# Patient Record
Sex: Female | Born: 1960 | Race: White | Hispanic: No | Marital: Married | Smoking: Never smoker
Health system: Southern US, Community
[De-identification: ages and names within clinical notes are randomized; demographics above are authoritative.]

## PROBLEM LIST (undated history)

## (undated) DIAGNOSIS — G40909 Epilepsy, unspecified, not intractable, without status epilepticus: Secondary | ICD-10-CM

## (undated) DIAGNOSIS — E119 Type 2 diabetes mellitus without complications: Secondary | ICD-10-CM

## (undated) DIAGNOSIS — D649 Anemia, unspecified: Secondary | ICD-10-CM

## (undated) DIAGNOSIS — K259 Gastric ulcer, unspecified as acute or chronic, without hemorrhage or perforation: Secondary | ICD-10-CM

## (undated) DIAGNOSIS — I10 Essential (primary) hypertension: Secondary | ICD-10-CM

## (undated) DIAGNOSIS — J45909 Unspecified asthma, uncomplicated: Secondary | ICD-10-CM

## (undated) DIAGNOSIS — E7800 Pure hypercholesterolemia, unspecified: Secondary | ICD-10-CM

## (undated) DIAGNOSIS — M1990 Unspecified osteoarthritis, unspecified site: Secondary | ICD-10-CM

## (undated) HISTORY — DX: Type 2 diabetes mellitus without complications: E11.9

## (undated) HISTORY — DX: Epilepsy, unspecified, not intractable, without status epilepticus: G40.909

## (undated) HISTORY — DX: Unspecified osteoarthritis, unspecified site: M19.90

## (undated) HISTORY — PX: ABDOMINAL HYSTERECTOMY: SHX81

## (undated) HISTORY — DX: Anemia, unspecified: D64.9

## (undated) HISTORY — DX: Essential (primary) hypertension: I10

## (undated) HISTORY — DX: Pure hypercholesterolemia, unspecified: E78.00

## (undated) HISTORY — PX: REPLACEMENT TOTAL KNEE: SUR1224

---

## 2004-12-15 ENCOUNTER — Ambulatory Visit: Payer: Self-pay | Admitting: Unknown Physician Specialty

## 2006-01-05 ENCOUNTER — Ambulatory Visit: Payer: Self-pay | Admitting: Internal Medicine

## 2007-02-02 HISTORY — PX: BREAST SURGERY: SHX581

## 2007-07-26 ENCOUNTER — Ambulatory Visit: Payer: Self-pay | Admitting: Internal Medicine

## 2007-08-07 ENCOUNTER — Ambulatory Visit: Payer: Self-pay | Admitting: Internal Medicine

## 2007-08-29 ENCOUNTER — Ambulatory Visit: Payer: Self-pay | Admitting: General Surgery

## 2008-03-21 ENCOUNTER — Ambulatory Visit: Payer: Self-pay | Admitting: Internal Medicine

## 2009-03-24 ENCOUNTER — Ambulatory Visit: Payer: Self-pay | Admitting: Internal Medicine

## 2010-03-31 ENCOUNTER — Ambulatory Visit: Payer: Self-pay | Admitting: Internal Medicine

## 2011-04-20 ENCOUNTER — Ambulatory Visit: Payer: Self-pay | Admitting: Internal Medicine

## 2011-08-09 ENCOUNTER — Ambulatory Visit: Payer: Self-pay | Admitting: Specialist

## 2011-08-09 LAB — URINALYSIS, COMPLETE
Bilirubin,UR: NEGATIVE
Blood: NEGATIVE
Hyaline Cast: 1
Ph: 7 (ref 4.5–8.0)
RBC,UR: 1 /HPF (ref 0–5)
Specific Gravity: 1.008 (ref 1.003–1.030)
Squamous Epithelial: 1
Transitional Epi: <1

## 2011-08-09 LAB — CBC
MCH: 28.6 pg (ref 26.0–34.0)
MCV: 87 fL (ref 80–100)
Platelet: 214 10*3/uL (ref 150–440)
RDW: 15.2 % — ABNORMAL HIGH (ref 11.5–14.5)
WBC: 7.9 10*3/uL (ref 3.6–11.0)

## 2011-08-09 LAB — BASIC METABOLIC PANEL
Anion Gap: 8 (ref 7–16)
Co2: 31 mmol/L (ref 21–32)
Creatinine: 0.75 mg/dL (ref 0.60–1.30)
EGFR (African American): >60
Osmolality: 282 (ref 275–301)

## 2011-08-09 LAB — MRSA PCR SCREENING

## 2011-08-09 LAB — PROTIME-INR
INR: 0.9
Prothrombin Time: 12.7 secs (ref 11.5–14.7)

## 2011-08-17 ENCOUNTER — Inpatient Hospital Stay: Payer: Self-pay | Admitting: Specialist

## 2011-08-18 LAB — CBC WITH DIFFERENTIAL/PLATELET
Basophil #: 0 10*3/uL (ref 0.0–0.1)
Eosinophil #: 0 10*3/uL (ref 0.0–0.7)
Eosinophil %: 0 %
Lymphocyte #: 1.2 10*3/uL (ref 1.0–3.6)
MCH: 29 pg (ref 26.0–34.0)
MCHC: 33.3 g/dL (ref 32.0–36.0)
MCV: 87 fL (ref 80–100)
Neutrophil #: 9 10*3/uL — ABNORMAL HIGH (ref 1.4–6.5)
Neutrophil %: 80.3 %
RBC: 4.16 10*6/uL (ref 3.80–5.20)
RDW: 15.6 % — ABNORMAL HIGH (ref 11.5–14.5)
WBC: 11.2 10*3/uL — ABNORMAL HIGH (ref 3.6–11.0)

## 2011-08-18 LAB — BASIC METABOLIC PANEL
Anion Gap: 13 (ref 7–16)
BUN: 13 mg/dL (ref 7–18)
Calcium, Total: 8.2 mg/dL — ABNORMAL LOW (ref 8.5–10.1)
Chloride: 97 mmol/L — ABNORMAL LOW (ref 98–107)
Co2: 27 mmol/L (ref 21–32)
EGFR (African American): >60
Osmolality: 275 (ref 275–301)
Potassium: 4.2 mmol/L (ref 3.5–5.1)

## 2011-08-19 LAB — HEMOGLOBIN: HGB: 10.1 g/dL — ABNORMAL LOW

## 2012-04-20 ENCOUNTER — Ambulatory Visit: Payer: Self-pay | Admitting: Internal Medicine

## 2012-04-24 ENCOUNTER — Ambulatory Visit: Payer: Self-pay | Admitting: Internal Medicine

## 2012-05-03 ENCOUNTER — Ambulatory Visit (INDEPENDENT_AMBULATORY_CARE_PROVIDER_SITE_OTHER): Payer: BC Managed Care – PPO | Admitting: General Surgery

## 2012-05-03 ENCOUNTER — Encounter: Payer: Self-pay | Admitting: General Surgery

## 2012-05-03 VITALS — BP 130/72 | HR 78 | Resp 16 | Ht 62.0 in | Wt 244.0 lb

## 2012-05-03 DIAGNOSIS — G40909 Epilepsy, unspecified, not intractable, without status epilepticus: Secondary | ICD-10-CM | POA: Insufficient documentation

## 2012-05-03 DIAGNOSIS — R920 Mammographic microcalcification found on diagnostic imaging of breast: Secondary | ICD-10-CM

## 2012-05-03 DIAGNOSIS — I10 Essential (primary) hypertension: Secondary | ICD-10-CM | POA: Insufficient documentation

## 2012-05-03 NOTE — Patient Instructions (Addendum)
The stereotactic procedure was reviewed with the patient. The potential for bleeding, infection and pain was reviewed. At this time, the benefits outweigh the risk, and the patient is amenable to proceed.  Patient has been scheduled for a right breast stereotactic biopsy at [facility] for 05-15-12 at 4 pm (arrive 3:30 pm). She is aware of all instructions.

## 2012-05-03 NOTE — Progress Notes (Signed)
Patient ID: Dana Murillo, female   DOB: [birth year], 52 y.o.   MRN: [id number]  Chief Complaint  Patient presents with  . Breast Problem    HPI Dana Murillo is a 52 y.o. female.  Patient here today for right breast evaluation referred by Dr Arlana Pouch.  States that "I can't feel anything".  No breast issues.  Patient with known history of right breast calcifications July 2009. No family history of breast cancer. Denies breast trauma. HPI  Past Medical History  Diagnosis Date  . Hypertension   . High cholesterol   . Epilepsia   . Arthritis     Past Surgical History  Procedure Laterality Date  . Abdominal hysterectomy    . Replacement total knee    . Breast surgery Right 2009    No family history on file.  Social History History  Substance Use Topics  . Smoking status: Never Smoker   . Smokeless tobacco: Never Used  . Alcohol Use: No    No Known Allergies  Current Outpatient Prescriptions  Medication Sig Dispense Refill  . Calcium Carbonate-Vit D-Min (CALCIUM 1200 PO) Take by mouth.      . carvedilol (COREG) 12.5 MG tablet Take 12.5 mg by mouth 2 (two) times daily with a meal.      . diclofenac (VOLTAREN) 75 MG EC tablet Take 75 mg by mouth 2 (two) times daily.      . divalproex (DEPAKOTE) 250 MG DR tablet Take 250 mg by mouth 2 (two) times daily.      . divalproex (DEPAKOTE) 500 MG DR tablet Take 500 mg by mouth 1 day or 1 dose.      . fish oil-omega-3 fatty acids 1000 MG capsule Take 2 g by mouth daily.      . Multiple Vitamins-Minerals (MULTIVITAMIN WITH MINERALS) tablet Take 1 tablet by mouth daily.      . simvastatin (ZOCOR) 40 MG tablet Take 40 mg by mouth every evening.       No current facility-administered medications for this visit.    Review of Systems Review of Systems  Constitutional: Negative.   Respiratory: Negative.   Cardiovascular: Negative.     Blood pressure 130/72, pulse 78, resp. rate 16, height 5\' 2"  (1.575 m), weight 244 lb (110.678  kg).  Physical Exam Physical Exam  Constitutional: She is oriented to person, place, and time. She appears well-developed and well-nourished.  Cardiovascular: Normal rate and regular rhythm.   Pulmonary/Chest: Effort normal and breath sounds normal. Right breast exhibits no inverted nipple, no mass, no nipple discharge, no skin change and no tenderness. Left breast exhibits no inverted nipple, no mass, no nipple discharge, no skin change and no tenderness.  Lymphadenopathy:    She has no cervical adenopathy.    She has no axillary adenopathy.  Neurological: She is alert and oriented to person, place, and time.  Skin: Skin is warm and dry.    Data Reviewed Screening mammograms dated 04/20/2012 identified microcalcifications in the upper-outer area of the right breast. Slightly more prominent than past exams. Able calcifications throughout the remainder of both breasts. BI-RAD-0.  Focal spot compression views did 04/24/2012 showed multiple groupings of calcification more prominent than past studies. No layering to suggest milk of calcium. BI-RAD-4. Right breast stereotactic core biopsy pathology report dated 08/29/2007 showed benign fibrous breast tissue with nonproliferative fibrocystic changes, no evidence of malignancy. Vicryl calcifications identified within areas of apical change as well as uninvolved benign breast tissue.  Assessment  Recurrent microcalcifications of the right breast.    Plan    Considering the increased density of microcalcifications in the upper-outer quadrant of the breast since 2009 biopsy repeat biopsy has been recommended.       Earline Mayotte 05/03/2012, 4:48 PM

## 2012-05-10 ENCOUNTER — Ambulatory Visit: Payer: Self-pay | Admitting: General Surgery

## 2012-05-15 ENCOUNTER — Ambulatory Visit: Payer: Self-pay | Admitting: General Surgery

## 2012-05-15 DIAGNOSIS — R920 Mammographic microcalcification found on diagnostic imaging of breast: Secondary | ICD-10-CM

## 2012-05-16 ENCOUNTER — Telehealth: Payer: Self-pay | Admitting: *Deleted

## 2012-05-16 LAB — PATHOLOGY REPORT

## 2012-05-16 NOTE — Telephone Encounter (Signed)
Notified patient that the most recent right breast biopsy results were benign per Dr. Lemar Livings, patient pleased.  Reminded of nurse appointment.

## 2012-05-17 ENCOUNTER — Encounter: Payer: Self-pay | Admitting: General Surgery

## 2012-05-23 ENCOUNTER — Ambulatory Visit (INDEPENDENT_AMBULATORY_CARE_PROVIDER_SITE_OTHER): Payer: BC Managed Care – PPO | Admitting: *Deleted

## 2012-05-23 DIAGNOSIS — Z87898 Personal history of other specified conditions: Secondary | ICD-10-CM

## 2012-05-23 DIAGNOSIS — Z789 Other specified health status: Secondary | ICD-10-CM

## 2012-05-23 NOTE — Progress Notes (Signed)
Patient here today for follow up post right breast biopsy.  Half of her breast is bruised, states "I bruise easy". No signs of infection.  The patient is aware that a heating pad may be used for comfort as needed.  Aware of pathology.  When will she Follow up?

## 2012-05-23 NOTE — Patient Instructions (Addendum)
Follow-up 6 months

## 2012-05-24 NOTE — Progress Notes (Signed)
Quick Note:  The right breast biopsy dated 05/15/2012 showed benign breast tissue with apical microsystem focal complex gross lesions with associated microcalcifications. Sclerosing adenosis in usual ductal hyperplasia. No atypia or malignancy.    The patient has been notified of the results. Arrangements were made for followup right breast mammogram and office visit in 6 months. ______

## 2012-06-13 ENCOUNTER — Encounter: Payer: Self-pay | Admitting: General Surgery

## 2012-07-11 ENCOUNTER — Ambulatory Visit: Payer: Self-pay | Admitting: Specialist

## 2012-07-11 LAB — APTT: Activated PTT: 37.9 secs — ABNORMAL HIGH (ref 23.6–35.9)

## 2012-07-11 LAB — URINALYSIS, COMPLETE
Bacteria: NONE SEEN
Bilirubin,UR: NEGATIVE
Blood: NEGATIVE
Glucose,UR: NEGATIVE mg/dL (ref 0–75)
Ketone: NEGATIVE
Leukocyte Esterase: NEGATIVE
Nitrite: NEGATIVE
Ph: 7 (ref 4.5–8.0)
RBC,UR: <1 /HPF (ref 0–5)
WBC UR: NONE SEEN /HPF (ref 0–5)

## 2012-07-11 LAB — CBC
HGB: 13.8 g/dL (ref 12.0–16.0)
MCH: 28.4 pg (ref 26.0–34.0)
MCHC: 33.5 g/dL (ref 32.0–36.0)
MCV: 85 fL (ref 80–100)
Platelet: 247 10*3/uL (ref 150–440)
RBC: 4.87 10*6/uL (ref 3.80–5.20)

## 2012-07-11 LAB — BASIC METABOLIC PANEL
BUN: 15 mg/dL (ref 7–18)
Chloride: 102 mmol/L (ref 98–107)
Creatinine: 0.56 mg/dL — ABNORMAL LOW (ref 0.60–1.30)
EGFR (Non-African Amer.): >60
Osmolality: 279 (ref 275–301)
Potassium: 3.8 mmol/L (ref 3.5–5.1)
Sodium: 139 mmol/L (ref 136–145)

## 2012-07-11 LAB — PROTIME-INR
INR: 1
Prothrombin Time: 13.6 secs (ref 11.5–14.7)

## 2012-07-11 LAB — MRSA PCR SCREENING

## 2012-07-24 ENCOUNTER — Inpatient Hospital Stay: Payer: Self-pay | Admitting: Specialist

## 2012-07-25 LAB — CBC WITH DIFFERENTIAL/PLATELET
Basophil %: 0.3 %
Eosinophil %: 0.2 %
HCT: 37.4 % (ref 35.0–47.0)
Lymphocyte #: 1.3 10*3/uL (ref 1.0–3.6)
Lymphocyte %: 12.7 %
MCHC: 33.9 g/dL (ref 32.0–36.0)
MCV: 85 fL (ref 80–100)
Monocyte %: 8.8 %
Platelet: 217 10*3/uL (ref 150–440)
RBC: 4.41 10*6/uL (ref 3.80–5.20)
WBC: 10.3 10*3/uL (ref 3.6–11.0)

## 2012-07-25 LAB — BASIC METABOLIC PANEL
BUN: 11 mg/dL (ref 7–18)
Calcium, Total: 8.3 mg/dL — ABNORMAL LOW (ref 8.5–10.1)
Creatinine: 0.69 mg/dL (ref 0.60–1.30)
EGFR (Non-African Amer.): >60
Glucose: 107 mg/dL — ABNORMAL HIGH (ref 65–99)
Osmolality: 276 (ref 275–301)
Potassium: 4.1 mmol/L (ref 3.5–5.1)
Sodium: 138 mmol/L (ref 136–145)

## 2012-07-26 LAB — HEMOGLOBIN: HGB: 11.7 g/dL — ABNORMAL LOW (ref 12.0–16.0)

## 2012-10-02 HISTORY — PX: BREAST BIOPSY: SHX20

## 2012-11-22 ENCOUNTER — Ambulatory Visit: Payer: BC Managed Care – PPO | Admitting: General Surgery

## 2013-02-13 ENCOUNTER — Ambulatory Visit: Payer: Self-pay | Admitting: General Surgery

## 2013-02-14 ENCOUNTER — Telehealth: Payer: Self-pay | Admitting: General Surgery

## 2013-02-14 ENCOUNTER — Encounter: Payer: Self-pay | Admitting: General Surgery

## 2013-02-14 NOTE — Telephone Encounter (Signed)
Notified patient path and mammograms reviewed. I don't think she requires open biopsy. Will discuss in detail at follow up next week.

## 2013-02-21 ENCOUNTER — Encounter: Payer: Self-pay | Admitting: General Surgery

## 2013-02-21 ENCOUNTER — Ambulatory Visit (INDEPENDENT_AMBULATORY_CARE_PROVIDER_SITE_OTHER): Payer: BC Managed Care – PPO | Admitting: General Surgery

## 2013-02-21 VITALS — BP 140/72 | HR 76 | Resp 14 | Ht 62.0 in | Wt 249.0 lb

## 2013-02-21 DIAGNOSIS — R920 Mammographic microcalcification found on diagnostic imaging of breast: Secondary | ICD-10-CM

## 2013-02-21 NOTE — Patient Instructions (Addendum)
Patient to return in 6 months bilateral diagnotic mammogram 

## 2013-02-21 NOTE — Progress Notes (Signed)
Patient ID: Dana Murillo, female   DOB: [birth year], 53 y.o.   MRN: [id number]  Chief Complaint  Patient presents with  . Follow-up    mammogram    HPI Dana Murillo is a 53 y.o. female who presents for a breast evaluation. The most recent right breast mammogram was done on 02/13/13.Patient does perform regular self breast checks and gets regular mammograms done.    HPI  Past Medical History  Diagnosis Date  . Hypertension   . High cholesterol   . Epilepsia   . Arthritis     Past Surgical History  Procedure Laterality Date  . Abdominal hysterectomy    . Replacement total knee Bilateral [id number]  . Breast surgery Right 2009    No family history on file.  Social History History  Substance Use Topics  . Smoking status: Never Smoker   . Smokeless tobacco: Never Used  . Alcohol Use: No    No Known Allergies  Current Outpatient Prescriptions  Medication Sig Dispense Refill  . Calcium Carbonate-Vit D-Min (CALCIUM 1200 PO) Take by mouth.      . carvedilol (COREG) 12.5 MG tablet Take 12.5 mg by mouth 2 (two) times daily with a meal.      . diclofenac (VOLTAREN) 75 MG EC tablet Take 75 mg by mouth 2 (two) times daily.      . divalproex (DEPAKOTE) 250 MG DR tablet Take 250 mg by mouth 2 (two) times daily.      . divalproex (DEPAKOTE) 500 MG DR tablet Take 500 mg by mouth 1 day or 1 dose.      . fish oil-omega-3 fatty acids 1000 MG capsule Take 2 g by mouth daily.      . Multiple Vitamins-Minerals (MULTIVITAMIN WITH MINERALS) tablet Take 1 tablet by mouth daily.      . simvastatin (ZOCOR) 40 MG tablet Take 40 mg by mouth every evening.       No current facility-administered medications for this visit.    Review of Systems Review of Systems  Constitutional: Negative.   Respiratory: Negative.   Cardiovascular: Negative.     Blood pressure 140/72, pulse 76, resp. rate 14, height 5\' 2"  (1.575 m), weight 249 lb (112.946 kg).  Physical Exam Physical Exam  Constitutional:  She is oriented to person, place, and time. She appears well-developed and well-nourished.  Eyes: Conjunctivae are normal.  Neck: Neck supple.  Cardiovascular: Normal rate, regular rhythm and normal heart sounds.   Pulmonary/Chest: Effort normal and breath sounds normal. Right breast exhibits no inverted nipple, no mass, no nipple discharge, no skin change and no tenderness. Left breast exhibits no inverted nipple, no mass, no nipple discharge, no skin change and no tenderness.  Lymphadenopathy:    She has no cervical adenopathy.    She has no axillary adenopathy.  Neurological: She is alert and oriented to person, place, and time.  Skin: Skin is warm and dry.    Data Reviewed Right breast biopsy dated 05/15/2012 showed benign breast tissue with a prepping microcyst and focal complex sclerosing lesion with associated microcalcification. Sclerosing adenosis and usual ductal hyperplasia. No atypia or malignancy.  The pathologist was asked to review the slides and reports that the area of complex sclerosing lesion measured 2 mm in diameter.  Post biopsy mammogram dated 02/13/2013 identified a TOPHAT marker in the lateral portion of the right breast. A second posterior clip is noted. The anterior clip (TOPHAT) associated with architectural distortion likely secondary to the previous hematoma that developed  after the biopsy.  Ultrasound examination reported visualization of a hypoechoic biopsy track in the 10:00 position with associated hyperechoic breast tissue thought to represent fat necrosis. Impression was mammographic distortion in sonographic hypoechoic tissue in the area of previous biopsy consistent with fat necrosis. Not possible to completely exclude malignancy. Radiology recommendation for formal excision. Assessment    The patient had a 2 mm area of complex sclerosing lesion in a much larger volume of normal tissue. Considering a 9-gauge biopsy was completed the likelihood of additional  tissue a small. This issue has been reviewed with the patient in the past and a plan for continued observation acceptable to the patient and myself.    Plan    Options for repeat excision were reviewed with the possible risks associated with repeat current hematoma formation and pain as well as ongoing distortion of the remaining breast parenchyma. The possibility of the coldness malignancy was reviewed. At this time the patient desires and I agree with planned observation.       Robert Bellow 02/22/2013, 5:17 PM

## 2013-08-22 ENCOUNTER — Encounter: Payer: Self-pay | Admitting: General Surgery

## 2013-08-28 ENCOUNTER — Ambulatory Visit: Payer: BC Managed Care – PPO | Admitting: General Surgery

## 2013-09-05 ENCOUNTER — Ambulatory Visit (INDEPENDENT_AMBULATORY_CARE_PROVIDER_SITE_OTHER): Payer: BC Managed Care – PPO | Admitting: General Surgery

## 2013-09-05 ENCOUNTER — Encounter: Payer: Self-pay | Admitting: General Surgery

## 2013-09-05 VITALS — BP 124/72 | HR 74 | Resp 14 | Ht 62.0 in | Wt 257.0 lb

## 2013-09-05 DIAGNOSIS — R920 Mammographic microcalcification found on diagnostic imaging of breast: Secondary | ICD-10-CM

## 2013-09-05 NOTE — Progress Notes (Signed)
Patient ID: Dana Murillo, female   DOB: August 08, 1960, 53 y.o.   MRN: [id number]  Chief Complaint  Patient presents with  . Follow-up    mammogram    HPI Dana Murillo is a 53 y.o. female who presents for a breast evaluation. The most recent mammogram was done on 08/21/13 at [facility]. Patient does perform regular self breast checks and gets regular mammograms done.    HPI  Past Medical History  Diagnosis Date  . Hypertension   . High cholesterol   . Epilepsia   . Arthritis     Past Surgical History  Procedure Laterality Date  . Abdominal hysterectomy    . Replacement total knee Bilateral [id number]  . Breast surgery Right 2009    No family history on file.  Social History History  Substance Use Topics  . Smoking status: Never Smoker   . Smokeless tobacco: Never Used  . Alcohol Use: No    No Known Allergies  Current Outpatient Prescriptions  Medication Sig Dispense Refill  . Calcium Carbonate-Vit D-Min (CALCIUM 1200 PO) Take by mouth.      . carvedilol (COREG) 12.5 MG tablet Take 12.5 mg by mouth 2 (two) times daily with a meal.      . diclofenac (VOLTAREN) 75 MG EC tablet Take 75 mg by mouth 2 (two) times daily.      . divalproex (DEPAKOTE) 250 MG DR tablet Take 250 mg by mouth 2 (two) times daily.      . fish oil-omega-3 fatty acids 1000 MG capsule Take 2 g by mouth daily.      . fluticasone-salmeterol (ADVAIR HFA) 45-21 MCG/ACT inhaler Inhale 2 puffs into the lungs as needed.      Marland Kitchen glucosamine-chondroitin 500-400 MG tablet Take 1 tablet by mouth 3 (three) times daily.      . Multiple Vitamins-Minerals (MULTIVITAMIN WITH MINERALS) tablet Take 1 tablet by mouth daily.      . simvastatin (ZOCOR) 40 MG tablet Take 40 mg by mouth every evening.       No current facility-administered medications for this visit.    Review of Systems Review of Systems  Constitutional: Negative.   Respiratory: Negative.   Cardiovascular: Negative.     Blood pressure 124/72, pulse 74,  resp. rate 14, height 5\' 2"  (1.575 m), weight 257 lb (116.574 kg).  Physical Exam Physical Exam  Constitutional: She is oriented to person, place, and time. She appears well-developed and well-nourished.  Eyes: Conjunctivae are normal. No scleral icterus.  Neck: Neck supple.  Cardiovascular: Normal rate, regular rhythm and normal heart sounds.   Pulmonary/Chest: Effort normal and breath sounds normal. Right breast exhibits no inverted nipple, no mass, no nipple discharge, no skin change and no tenderness. Left breast exhibits no inverted nipple, no mass, no nipple discharge, no skin change and no tenderness.  Abdominal: Soft. Bowel sounds are normal. There is no tenderness.  Lymphadenopathy:    She has no cervical adenopathy.    She has no axillary adenopathy.  Neurological: She is alert and oriented to person, place, and time.  Skin: Skin is warm and dry.    Data Reviewed Right breast biopsy dated 05/15/2012 showed benign breast tissue with microcysts and a focal complex sclerosing lesion measuring 2 mm in diameter with associated microcalcification. Sclerosing adenosis and usual ductal hyperplasia. No atypia or malignancy.    Bilateral mammograms dated 08/21/2013 completed at [facility] were reviewed independently. No interval change. BI-RAD-4 based on previous report of a complex sclerosing  lesion. Assessment    Benign breast exam and mammogram.    Plan    The identification of a 2 mm sclerosing lesion in a large-volume of adjacent tissue is associated with a less than 1% risk of upstaging to any other pathologic process. No interval change on her mammogram the patient and I are both comfortable and continued observation. We'll arrange for bilateral diagnostic mammograms in one year.      PCP: Irven Easterly 09/07/2013, 10:12 AM

## 2013-09-05 NOTE — Patient Instructions (Addendum)
Patient to return to return in one year bilateral diagnotic mammogram.

## 2013-12-03 ENCOUNTER — Encounter: Payer: Self-pay | Admitting: General Surgery

## 2014-05-21 NOTE — H&P (Signed)
    Subjective/Chief Complaint Knee pain    History of Present Illness 54 year old female has advancd bilateral knee arthritis which has become incompatible with daily activities. She has had NSAIDs, injections, bracing and activity modification since 2008 which no longer give her relief.  X-rays show bone on bone arthritits medially with patellar spurring as well with some cyst formation.  She wishes to proceed with right total knee replacement notw.  Risks and benefits of surgery were discussed at length including but not limited to infection, non union, nerve or blood vessed damage, non union, need for repeat surgery, blood clots and lung emboli, and death. She has been approved for surgery by her medical doctors.   Past Med/Surgical Hx:  Recent hayfever: Had to use inhaler  Arthritis:   Obesity:   Hypertension:   Seizure disorder: Last 17 years ago  Right shoulder surgery:   D&C:   Breast Biopsy:   Hysterectomy, BSO:   ALLERGIES:  No Known Allergies:   HOME MEDICATIONS: Medication Instructions Status  Oyster Shell Calcium with Vitamin D 2 tab(s) orally once a day (in the morning) Active  multivitamin 1 tab(s) orally once a day (in the morning) Stop on 08/09/11 Active  Fish Oil 1000 mg oral capsule 4 cap(s) orally once a day (in the morning) Stop 08/09/11 Active  chondroitin-glucosamine 2 tab(s) orally once a day (in the morning) Stop 08/09/11 Active  fluticasone 50 mcg/inh nasal spray 2 spray(s) each nostril once a day (in the morning) Active  diclofenac potassium 50 mg oral tablet 1 tab(s) orally 2 times a day. Stop 08/09/11. Active  Depakote 500 mg oral delayed release tablet 1 tab(s) orally once a day at lunch Active  simvastatin 40 mg oral tablet 1 tab(s) orally once a day (at bedtime) Active  carvedilol 12.5 mg oral tablet 1 tab(s) orally 2 times a day. Take day of surgery before coming Active  Depakote 250 mg oral delayed release tablet 1 tab(s) orally 2 times a day morning and night.  Active  Advair Diskus 500 mcg-50 mcg inhalation powder 1 puff(s) inhaled 2 times a day. Use AM of surgery and bring with you. Active   Family and Social History:   Family History Non-Contributory    Social History negative tobacco, negative ETOH    Place of Living Home   Review of Systems:   Fever/Chills No    Cough No    Sputum No    Abdominal Pain No   Physical Exam:   GEN well developed, well nourished, obese    HEENT pink conjunctivae    NECK supple    RESP normal resp effort    CARD regular rate    ABD denies tenderness    LYMPH negative neck    EXTR negative edema, varus both knees with medial joint line pain and tenderness.  crepitus with range of motion from 5-90*.  circulation/sensation/motor function good distally.  stability good.    SKIN normal to palpation    NEURO motor/sensory function intact    PSYCH alert, A+O to time, place, person     Assessment/Admission Diagnosis Bilateral knee arthritis--advanced    Plan Right total knee replacement   Electronic Signatures: Park Breed (MD)  (Signed 15-Jul-13 15:29)  Authored: CHIEF COMPLAINT and HISTORY, PAST MEDICAL/SURGIAL HISTORY, ALLERGIES, HOME MEDICATIONS, FAMILY AND SOCIAL HISTORY, REVIEW OF SYSTEMS, PHYSICAL EXAM, ASSESSMENT AND PLAN   Last Updated: 15-Jul-13 15:29 by Park Breed (MD)

## 2014-05-21 NOTE — Discharge Summary (Signed)
PATIENT NAME:  Dana Murillo, Dana Murillo MR#:  [id number] DATE OF BIRTH:  [birth year]  DATE OF ADMISSION:  08/17/2011 DATE OF DISCHARGE:  08/20/2011  FINAL DIAGNOSES:  1. Advanced bilateral knee arthritis. 2. Obesity.  3. Hypertension.  4. Hay fever.  5. Seizure disorder, remote.   PROCEDURE: 08/17/2011 cemented right DePuy LCS rotating platform total knee replacement.   COMPLICATIONS: None.   CONSULTATIONS: None.   DISCHARGE MEDICATIONS:  1. Oyster shell calcium.  2. Multivitamins. 3. Fish oil. 4. Chondroitin/glucosamine. 5. Fluticasone 50-mcg inhaler, two sprays each nostril q. a.m.  6. Depakote 500 mg daily.  7. Simvastatin 40 mg daily.  8. Carvedilol 12.5 mg b.i.d.  9. Depakote 250 mg b.i.d.  10. Advair Diskus 500 mcg 1 puff b.i.d.  11. Norco 5/325 p.r.n. pain.  12. Enteric-coated aspirin one p.o. b.i.d.  13. Iron 1 tablet daily.   HISTORY OF PRESENT ILLNESS: The patient is a 54 year old female with advanced bilateral knee arthritis. It has been incompatible with daily activities. She could not do stairs or bend well. She has had NSAID treatment, injections, braces, and activity modifications since 2008, which no longer give her relief. X-rays show bone-on-bone arthritis medially with patellar spurring as well as some cyst formation. Risks and benefits of surgery were discussed with her preoperatively and she wished to proceed with a right total knee replacement.   PAST MEDICAL HISTORY:  ILLNESSES: As above.   ALLERGIES:  None.   MEDICATIONS: As above.  REVIEW OF SYSTEMS: Unremarkable.   FAMILY HISTORY: Unremarkable.   SOCIAL HISTORY: The patient does not smoke or drink and lives at home with her husband.   PHYSICAL EXAMINATION: Vital signs were normal. The right knee showed motion from 5 to 90 degrees. Circulation, sensation, and motor function were good distally. Stability was good. There was pain along the medial joint line. X-rays of the right knee showed advanced  osteoarthritis with spurs and cyst formation.   HOSPITAL COURSE: The patient underwent a right total knee replacement on 08/17/2011. Postoperatively she did well. Her hemoglobin was 10.1 on the first postoperative day. She did well with physical therapy. Her wound remained clean and dry. She was discharged on 08/20/2011. She will be seen in my office in two weeks. She is partial weight-bearing with a walker. She will get home physical therapy.   ____________________________ Park Breed, MD hem:bjt D: 08/30/2011 15:39:03 ET T: 08/31/2011 13:54:29 ET JOB#: [id number]  cc: Park Breed, MD, <Dictator> Leona Carry. Hall Busing, MD Park Breed MD ELECTRONICALLY SIGNED 09/01/2011 8:20

## 2014-05-21 NOTE — Op Note (Signed)
PATIENT NAME:  Dana Murillo, RAPE MR#:  [id number] DATE OF BIRTH:  1960-11-04  DATE OF PROCEDURE:  08/17/2011  PREOPERATIVE DIAGNOSIS: Advanced osteoarthritis, right knee.   POSTOPERATIVE DIAGNOSIS: Advanced osteoarthritis, right knee.   PROCEDURE PERFORMED: DePuy cemented LCS rotating platform total knee replacement (standard femur/patella, 12.5 mm polyethylene bearing, #2.5 keeled tibia).   SURGEON: Park Breed, MD  ANESTHESIA: Spinal.   COMPLICATIONS: None.   DRAINS: Two Autovac.   ESTIMATED BLOOD LOSS: Minimal.   REPLACEMENTS: None.   DESCRIPTION OF PROCEDURE: Patient was brought to the Operating Room where she underwent satisfactory spinal anesthesia and was placed in supine position on the Operating Room table and padded appropriately. The right leg was prepped and draped in sterile fashion. Esmarch was applied and tourniquet inflated to 350 mmHg. Tourniquet time was 117 minutes. A longitudinal midline anterior incision was made and dissection carried out sharply subcutaneous tissue. Medial arthrotomy was carried out and soft tissue debridement carried out. The tibial alignment jig was put in place and anterior cutting block pinned. The tibial cut was made. The ligaments were then balanced releasing some medially. The femur was sized as a standard and a centering hole made in the femur. The anterior cutting block was oriented with the rotation guide with a 12.5 mm shim. The pins were put in place and the anterior and posterior cuts were made. The distal femoral cutting guide was inserted 4 degrees valgus. This was pinned in place and the distal cuts made. Extension gap was excellent as well a 12.5 mm. The finishing guide was applied and cuts made. The tibia was sized as a 2.5 and centering hole made. The trial was inserted along with a 12.5 mm spacer and standard femoral component. Knee articulated well with good alignment and good motion and stability. Patella was cut and drilled and  the trial inserted. This tracked well. The trials were removed and knee thoroughly irrigated and dried while the cement was mixed. A #2.5 tibia keeled tibia, standard femur and standard patella were all cemented in place along with a 12.5 mm rotating platform polyethylene bearing. Excess cement was removed as cement was allowed harden in full extension. The joint was thoroughly irrigated again. 60 mL of 0.25% Marcaine with epinephrine, 30 mg of Toradol and 4 mg of morphine were instilled into the capsule and soft tissues. Bone wax was used to isolate raw bony surfaces. The Autovacs were inserted and the capsule was closed with #2 quill suture. The subcutaneous tissue was closed with 0 quill and skin was closed with staples. Dry sterile dressing along with TENS pads, Polar Care and knee immobilizer were applied. Tourniquet was deflated with good return of blood flow to the foot. Patient was transferred to her hospital bed and taken to recovery in good condition.   ____________________________ Park Breed, MD hem:cms D: 08/17/2011 11:45:23 ET T: 08/17/2011 12:48:01 ET JOB#: [id number]  cc: Park Breed, MD, <Dictator> Park Breed MD ELECTRONICALLY SIGNED 08/18/2011 7:39

## 2014-05-24 NOTE — H&P (Signed)
   Subjective/Chief Complaint Pain left knee   History of Present Illness 54 year old female with advanced osteoarthritis left knee that has failed treatment with NSAIDs, injections, bracing, and exercise.  She has pain on a daily basis that interfered with her work and other activities.  Causes sleep disturbance as well.  Has had a successful right total knee replacement one year ago and wishes to proceed with left total knee replacement.  Risks and benefits of surgery were discussed at length including but not limited to infection, non union, nerve or blood vessed damage, non union, need for repeat surgery, blood clots and lung emboli, and death.  X-rays show severe varus, loss of joint space, generalized spurring, sclerosis, and cyst formation.   Past Med/Surgical Hx:  Recent hayfever: Had to use inhaler  Arthritis:   Obesity:   Hypertension:   Seizure disorder: Last 17 years ago  Right shoulder surgery:   D&C:   Breast Biopsy:   Hysterectomy, BSO:   ALLERGIES:  Morphine: N/V/Diarrhea  HOME MEDICATIONS: Medication Instructions Status  multivitamin 1 tab(s) orally once a day (in the morning) Stop on 08/09/11 Active  Fish Oil 1000 mg oral capsule 4 cap(s) orally once a day (in the morning) Stop 08/09/11 Active  fluticasone 50 mcg/inh nasal spray 2 spray(s) each nostril once a day (in the morning) as needed. Active  diclofenac potassium 50 mg oral tablet 1 tab(s) orally 2 times a day. Stop 08/09/11. Active  Depakote 500 mg oral delayed release tablet 1 tab(s) orally once a day at lunch Active  simvastatin 40 mg oral tablet 1 tab(s) orally once a day (at bedtime) Active  carvedilol 12.5 mg oral tablet 1 tab(s) orally 2 times a day. Take day of surgery before coming Active  Depakote 250 mg oral delayed release tablet 1 tab(s) orally 2 times a day morning and night. Active  Advair Diskus 500 mcg-50 mcg inhalation powder 1 puff(s) inhaled 2 times a day, As Needed - for Shortness of Breath Active   Calcium 600+D Calcium 630 mg with 500 IU vitamin D -2 tab(s) orally once a day. Active  Osteo Bi-Flex Triple Strength 2 tab(s) orally once a day, also contains vitamin D. Active   Family and Social History:  Family History Non-Contributory   Social History negative tobacco   Place of Living Home   Review of Systems:  Fever/Chills No   Cough No   Sputum No   Abdominal Pain No   Physical Exam:  GEN well developed, well nourished, no acute distress, obese   HEENT pink conjunctivae   NECK supple   RESP normal resp effort   CARD regular rate   ABD denies tenderness   LYMPH negative neck   EXTR negative edema, varus deformity, form 10-95*,  circulation/sensation/motor function good distally.  skin intact   SKIN normal to palpation   NEURO motor/sensory function intact   PSYCH alert, A+O to time, place, person, good insight    Assessment/Admission Diagnosis Advanced osteoarthritis left knee   Plan Left total knee replacement   Electronic Signatures: Park Breed (MD)  (Signed 22-Jun-14 14:23)  Authored: CHIEF COMPLAINT and HISTORY, PAST MEDICAL/SURGIAL HISTORY, ALLERGIES, HOME MEDICATIONS, FAMILY AND SOCIAL HISTORY, REVIEW OF SYSTEMS, PHYSICAL EXAM, ASSESSMENT AND PLAN   Last Updated: 22-Jun-14 14:23 by Park Breed (MD)

## 2014-05-24 NOTE — Op Note (Signed)
PATIENT NAME:  Dana Murillo, FAW MR#:  [id number] DATE OF BIRTH:  07/29/1960  DATE OF PROCEDURE:  07/24/2012  PREOPERATIVE DIAGNOSIS:  Advanced osteoarthritis, left knee.   POSTOPERATIVE DIAGNOSIS:  Advanced osteoarthritis, left knee.   OPERATION:  Cemented rotating platform DePuy LCS total knee replacement (standard femur/patella, 10 mm polyethylene insert and #2.5 keeled tibial component).   SURGEON:  Park Breed, MD  ASSISTANT:  Christophe Louis, MD  ANESTHESIA:  Spinal.   COMPLICATIONS:  None.   DRAINS:  Two Autovacs.   ESTIMATED BLOOD LOSS:  Minimal; replaced none.   DESCRIPTION OF PROCEDURE:  The patient was brought to the operating room where she underwent satisfactory spinal anesthesia and was placed and padded appropriately on the operating room table. The left leg was prepped and draped in a sterile fashion. An Esmarch was applied. The tourniquet was inflated to 350 mmHg. Tourniquet time was 96 minutes.   An anterior midline incision was made and dissection carried out sharply through subcutaneous tissue. A medial arthrotomy was carried out and a soft tissue debridement was carried out. The tibial alignment jig was aligned and pinned in place. A proximal tibial cut was made. The ligaments were then balanced by releasing medially. The femur was sized as a standard. A centering hole was made and the anterior cutting jig was inserted. The rotation guide was inserted and alignment was excellent. The anterior and posterior cutting jig was pinned in place and anterior and posterior cuts were made. The 4 degree valgus distal femoral cutting guide was inserted and distal cut was made. The spacing device was a little tight so we repeated the distal femoral cut to 2.5 mm proximally. This allowed the spacer device to insert nicely and alignment was excellent. The finishing guide was applied to the distal femur and the cuts made. The tibia was sized as number 2.5 and a centering hole was  made. The tray was inserted and a 10 mm polyethylene trial and a standard femur were inserted. The knee articulated well and had excellent range of motion and good stability in extension and flexion. The patella was cut and drilled. A trial prosthesis was inserted and this tracked well. The trial components were all removed and the joint was thoroughly irrigated and final soft tissue debridement carried out. Bone surfaces were dried and the #2.5 keeled tibial component was cemented in place along with a cemented standard femur and patella. The 10 mm rotating platform guide was put in place. The excess cement was removed and the cement allowed to harden for 10 minutes. Final irrigation was carried out and the soft tissues were then infiltrated with 60 mL of 0.25% Marcaine with Toradol. THE PATIENT IS ALLERGIC TO MORPHINE.   The Autovac drains were inserted and the capsule closed with #2 quill. The subcutaneous tissue was closed after irrigation with 0 quill and the skin was closed with staples. A TENS dressing was applied over TENS pads. Polar Care and knee immobilizer were applied. The tourniquet was deflated with good return of blood flow and the Autovac was activated.   The patient was transferred to her hospital bed and taken to recovery in good condition.    ____________________________ Park Breed, MD hem:si D: 07/24/2012 15:35:43 ET T: 07/24/2012 22:57:10 ET JOB#: [id number]  cc: Park Breed, MD, <Dictator> Park Breed MD ELECTRONICALLY SIGNED 07/25/2012 11:52

## 2014-05-24 NOTE — Discharge Summary (Signed)
PATIENT NAME:  Dana Murillo, Dana MR#:  Murillo DATE OF BIRTH:  1960-09-22  DATE OF ADMISSION:  07/24/2012 DATE OF DISCHARGE:    PREOPERATIVE AND POSTOPERATIVE DIAGNOSES:  1.  Advanced osteoarthritis of the left knee.  2.  Obesity.  3.  Hypertension.  4.  Seizure disorder.  OPERATIONS: On 07/24/2012, cemented DePuy LCS rotating platform left total knee replacement.   COMPLICATIONS: None.   CONSULTATIONS: None.   DISCHARGE MEDICATIONS: Norco 5/325 p.r.n. pain, enteric-coated aspirin 1 p.o. b.i.d., Neurontin 400 mg b.i.d., Mobic 50 mg daily, simvastatin 40 mg daily, vitamins as needed, fluticasone 58 mcg inhaler to each nostril once a day, Depakote 500 mg twice a day, carvedilol  12.5 mg 2 times a day, calcium with vitamin D daily and Advair Diskus 1 puff 2 times a day as needed.   HISTORY OF PRESENT ILLNESS: The patient is a 54 year old female with advanced osteoarthritis of the left knee. She had failed conservative treatment with injections, NSAIDs, bracing and exercise. She had pain on a daily basis interfering with her work and now with sleep and other activities. She had a successful right total knee replacement a year ago and wished to proceed with a left one. The risks and benefits were discussed with the patient at length preoperatively.   PAST MEDICAL HISTORY: Illnesses: As above.   MEDICATIONS: As above.   ALLERGIES: MORPHINE.   PAST SURGICAL HISTORY: Right shoulder surgery, D and C, breast biopsy, hysterectomy, bilateral salpingo-oophorectomy and right total knee replacement.   REVIEW OF SYSTEMS: Unremarkable.   FAMILY HISTORY: Unremarkable.   SOCIAL HISTORY: The patient does not smoke or drink. 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  PHYSICAL EXAMINATION:  The patient was alert and cooperative. Vital signs were normal. Left knee showed varus deformity with motion from 10 to 95 degrees. Neurovascular status was good distally. She was stable. There was minimal  effusion. Right total knee showed excellent position and alignment with motion from 0 to 100 degrees.   LABORATORY DATA: On admission was satisfactory.   HOSPITAL COURSE: On 07/24/2012, the patient underwent left cemented total knee replacement. She did well postoperatively. She had moderate-to-severe pain for 48 hours. Her hemoglobin was 11.7 on the second postoperative day. She was ambulated and felt better by 07/27/2012. She is discharged home with her husband. She will get home physical therapy. She will remain partial weight bearing. She will return to my office in 2 weeks for exam and x-rays.  ____________________________ Park Breed, MD hem:aw D: 07/27/2012 13:28:41 ET T: 07/27/2012 14:02:56 ET JOB#: [id number]  cc: Park Breed, MD, <Dictator> Leona Carry. Hall Busing, MD Park Breed MD ELECTRONICALLY SIGNED 07/29/2012 21:14

## 2014-08-19 ENCOUNTER — Encounter: Payer: Self-pay | Admitting: General Surgery

## 2014-08-22 ENCOUNTER — Ambulatory Visit (INDEPENDENT_AMBULATORY_CARE_PROVIDER_SITE_OTHER): Payer: BC Managed Care – PPO | Admitting: General Surgery

## 2014-08-22 ENCOUNTER — Encounter: Payer: Self-pay | Admitting: General Surgery

## 2014-08-22 VITALS — BP 136/74 | HR 80 | Resp 16 | Ht 62.0 in | Wt 232.0 lb

## 2014-08-22 DIAGNOSIS — R920 Mammographic microcalcification found on diagnostic imaging of breast: Secondary | ICD-10-CM | POA: Diagnosis not present

## 2014-08-22 DIAGNOSIS — N6489 Other specified disorders of breast: Secondary | ICD-10-CM

## 2014-08-22 NOTE — Progress Notes (Addendum)
Patient ID: Dana Murillo, female   DOB: 08-12-1960, 54 y.o.   MRN: [id number]  Chief Complaint  Patient presents with  . Follow-up    mammogram    HPI Dana Murillo is a 54 y.o. female who presents for a breast evaluation. The most recent mammogram was done on 08/16/14.  Patient does perform regular self breast checks and gets regular mammograms done.    HPI  Past Medical History  Diagnosis Date  . Hypertension   . High cholesterol   . Epilepsia   . Arthritis     Past Surgical History  Procedure Laterality Date  . Abdominal hysterectomy    . Replacement total knee Bilateral [id number]  . Breast surgery Right 2009    No family history on file.  Social History History  Substance Use Topics  . Smoking status: Never Smoker   . Smokeless tobacco: Never Used  . Alcohol Use: No    No Known Allergies  Current Outpatient Prescriptions  Medication Sig Dispense Refill  . BREO ELLIPTA 100-25 MCG/INH AEPB     . Calcium Carbonate-Vit D-Min (CALCIUM 1200 PO) Take by mouth.    . carvedilol (COREG) 12.5 MG tablet Take 12.5 mg by mouth 2 (two) times daily with a meal.    . diclofenac (VOLTAREN) 75 MG EC tablet Take 75 mg by mouth 2 (two) times daily.    . divalproex (DEPAKOTE) 250 MG DR tablet Take 250 mg by mouth 3 (three) times daily.     . fish oil-omega-3 fatty acids 1000 MG capsule Take 2 g by mouth daily.    Marland Kitchen glucosamine-chondroitin 500-400 MG tablet Take 1 tablet by mouth 3 (three) times daily.    . Multiple Vitamins-Minerals (MULTIVITAMIN WITH MINERALS) tablet Take 1 tablet by mouth daily.    . simvastatin (ZOCOR) 40 MG tablet Take 40 mg by mouth every evening.     No current facility-administered medications for this visit.    Review of Systems Review of Systems  Constitutional: Negative.   Respiratory: Negative.   Cardiovascular: Negative.     Blood pressure 136/74, pulse 80, resp. rate 16, height 5\' 2"  (1.575 m), weight 232 lb (105.235 kg).  Physical  Exam Physical Exam  Constitutional: She is oriented to person, place, and time. She appears well-developed and well-nourished.  HENT:  Mouth/Throat: Oropharynx is clear and moist. No oropharyngeal exudate.  Eyes: Conjunctivae are normal. No scleral icterus.  Neck: Neck supple.  Cardiovascular: Normal rate, regular rhythm and normal heart sounds.   Pulmonary/Chest: Effort normal and breath sounds normal. Right breast exhibits no inverted nipple, no mass, no nipple discharge, no skin change and no tenderness. Left breast exhibits no inverted nipple, no mass, no nipple discharge, no skin change and no tenderness.  Lymphadenopathy:    She has no cervical adenopathy.    She has no axillary adenopathy.  Neurological: She is alert and oriented to person, place, and time.  Skin: Skin is warm and dry.    Data Reviewed Bilateral mammograms dated 08/16/2014 were reviewed. No interval change. BI-RADS-4 based on previous diagnoses of radial scar.  Assessment    Benign breast exam.    Plan    The indications for reexcision with radial scar were reviewed again with the patient. A 2 mm lesion was identified and a large volume of tissue removed with a 10-gauge vacuum device. Likelihood of missed pathology is exceptionally low and likely far less than the risks associated with reexcision. We'll plan for repeat examination  in one year    Patient will be asked to return to the office in one year with a bilateral diagnotic mammogram. PCP:  Irven Easterly 09/05/2014, 8:17 AM

## 2014-08-22 NOTE — Patient Instructions (Addendum)
Patient will be asked to return to the office in one year with a bilateral diagnotic mammogram. Continue self breast exams. Call office for any new breast issues or concerns.

## 2014-08-24 DIAGNOSIS — N6489 Other specified disorders of breast: Secondary | ICD-10-CM | POA: Insufficient documentation

## 2015-08-19 ENCOUNTER — Encounter: Payer: Self-pay | Admitting: *Deleted

## 2015-08-25 ENCOUNTER — Encounter: Payer: Self-pay | Admitting: *Deleted

## 2015-08-25 ENCOUNTER — Encounter: Payer: Self-pay | Admitting: General Surgery

## 2015-08-26 ENCOUNTER — Ambulatory Visit (INDEPENDENT_AMBULATORY_CARE_PROVIDER_SITE_OTHER): Payer: BC Managed Care – PPO | Admitting: General Surgery

## 2015-08-26 ENCOUNTER — Encounter: Payer: Self-pay | Admitting: General Surgery

## 2015-08-26 VITALS — BP 144/80 | HR 74 | Resp 16 | Ht 62.0 in | Wt 262.0 lb

## 2015-08-26 DIAGNOSIS — R920 Mammographic microcalcification found on diagnostic imaging of breast: Secondary | ICD-10-CM

## 2015-08-26 NOTE — Patient Instructions (Signed)
The patient is aware to call back for any questions or concerns.  

## 2015-08-26 NOTE — Progress Notes (Signed)
Patient ID: Dana Murillo, female   DOB: 1960/02/15, 55 y.o.   MRN: [id number]  Chief Complaint  Patient presents with  . Follow-up    mammogram    HPI Dana Murillo is a 55 y.o. female.  who presents for a breast evaluation. The most recent mammogram was done on 08-22-15.  Patient does perform regular self breast checks and gets regular mammograms done.  No new breast issues. She is on a prednisone taper for a "raspy" voice that started back in May.   HPI  Past Medical History:  Diagnosis Date  . Arthritis   . Epilepsia ([place])   . High cholesterol   . Hypertension     Past Surgical History:  Procedure Laterality Date  . ABDOMINAL HYSTERECTOMY    . BREAST BIOPSY Right 10/2012   BENIGN BREAST TISSUE  FOCAL COMPLEX SCLEROSING LESION  . BREAST SURGERY Right 2009  . REPLACEMENT TOTAL KNEE Bilateral 2013,2014   Dr Jonny Ruiz    No family history on file.  Social History Social History  Substance Use Topics  . Smoking status: Never Smoker  . Smokeless tobacco: Never Used  . Alcohol use No    No Known Allergies  Current Outpatient Prescriptions  Medication Sig Dispense Refill  . BREO ELLIPTA 100-25 MCG/INH AEPB     . Calcium Carbonate-Vit D-Min (CALCIUM 1200 PO) Take by mouth.    . carvedilol (COREG) 12.5 MG tablet Take 12.5 mg by mouth 2 (two) times daily with a meal.    . diclofenac (VOLTAREN) 75 MG EC tablet Take 75 mg by mouth 2 (two) times daily.    . divalproex (DEPAKOTE) 250 MG DR tablet Take 250 mg by mouth 3 (three) times daily. 500 mg at lunch    . fish oil-omega-3 fatty acids 1000 MG capsule Take 2 g by mouth daily.    Marland Kitchen glucosamine-chondroitin 500-400 MG tablet Take 1 tablet by mouth 3 (three) times daily.    . Multiple Vitamins-Minerals (MULTIVITAMIN WITH MINERALS) tablet Take 1 tablet by mouth daily.    . predniSONE (STERAPRED UNI-PAK 48 TAB) 10 MG (48) TBPK tablet Take by mouth daily.    . simvastatin (ZOCOR) 40 MG tablet Take 40 mg by mouth every  evening.     No current facility-administered medications for this visit.     Review of Systems Review of Systems  Constitutional: Negative.   Respiratory: Negative.   Cardiovascular: Negative.     Blood pressure (!) 144/80, pulse 74, resp. rate 16, height 5\' 2"  (1.575 m), weight 262 lb (118.8 kg).  Physical Exam Physical Exam  Constitutional: She is oriented to person, place, and time. She appears well-developed and well-nourished.  HENT:  Mouth/Throat: Oropharynx is clear and moist.  Eyes: Conjunctivae are normal. No scleral icterus.  Neck: Neck supple.  Cardiovascular: Normal rate, regular rhythm and normal heart sounds.   Pulmonary/Chest: Effort normal and breath sounds normal. Right breast exhibits no inverted nipple, no mass, no nipple discharge, no skin change and no tenderness. Left breast exhibits no inverted nipple, no mass, no nipple discharge, no skin change and no tenderness.  Lymphadenopathy:    She has no cervical adenopathy.    She has no axillary adenopathy.  Neurological: She is alert and oriented to person, place, and time.  Skin: Skin is warm and dry.  Psychiatric: Her behavior is normal.    Data Reviewed Bilateral diagnostic mammogram dated 08/22/2015 showed scattered microcalcifications both breasts. BI-RADS-2.  Assessment    Benign  breast exam.   Previous biopsy showing a foci of complex sclerosing lesion without evidence of recurrence.  Plan    Annual screening mammograms with her PCP would be appropriate.   Patient will be asked to hae bilateral screening mammogram through her PCP.  PCP Benita Stabile MD  This information has been scribed by Karie Fetch RN, BSN,BC.  Robert Bellow 08/26/2015, 3:20 PM

## 2016-02-27 ENCOUNTER — Emergency Department: Payer: BC Managed Care – PPO

## 2016-02-27 ENCOUNTER — Encounter: Payer: Self-pay | Admitting: Emergency Medicine

## 2016-02-27 ENCOUNTER — Emergency Department
Admission: EM | Admit: 2016-02-27 | Discharge: 2016-02-27 | Disposition: A | Discharge status: Home / Self Care | Payer: BC Managed Care – PPO | Attending: Student in an Organized Health Care Education/Training Program | Admitting: Student in an Organized Health Care Education/Training Program

## 2016-02-27 DIAGNOSIS — Z79899 Other long term (current) drug therapy: Secondary | ICD-10-CM | POA: Diagnosis not present

## 2016-02-27 DIAGNOSIS — M25519 Pain in unspecified shoulder: Secondary | ICD-10-CM

## 2016-02-27 DIAGNOSIS — S4991XA Unspecified injury of right shoulder and upper arm, initial encounter: Secondary | ICD-10-CM | POA: Diagnosis present

## 2016-02-27 DIAGNOSIS — Y999 Unspecified external cause status: Secondary | ICD-10-CM | POA: Insufficient documentation

## 2016-02-27 DIAGNOSIS — Y939 Activity, unspecified: Secondary | ICD-10-CM | POA: Diagnosis not present

## 2016-02-27 DIAGNOSIS — Y929 Unspecified place or not applicable: Secondary | ICD-10-CM | POA: Diagnosis not present

## 2016-02-27 DIAGNOSIS — X58XXXA Exposure to other specified factors, initial encounter: Secondary | ICD-10-CM | POA: Diagnosis not present

## 2016-02-27 DIAGNOSIS — G40909 Epilepsy, unspecified, not intractable, without status epilepticus: Secondary | ICD-10-CM | POA: Diagnosis not present

## 2016-02-27 DIAGNOSIS — S43014A Anterior dislocation of right humerus, initial encounter: Secondary | ICD-10-CM | POA: Diagnosis not present

## 2016-02-27 DIAGNOSIS — I10 Essential (primary) hypertension: Secondary | ICD-10-CM | POA: Insufficient documentation

## 2016-02-27 DIAGNOSIS — I4891 Unspecified atrial fibrillation: Secondary | ICD-10-CM

## 2016-02-27 DIAGNOSIS — R569 Unspecified convulsions: Secondary | ICD-10-CM

## 2016-02-27 DIAGNOSIS — Z791 Long term (current) use of non-steroidal anti-inflammatories (NSAID): Secondary | ICD-10-CM | POA: Insufficient documentation

## 2016-02-27 LAB — COMPREHENSIVE METABOLIC PANEL
ALBUMIN: 3.4 g/dL — AB (ref 3.5–5.0)
ALK PHOS: 49 U/L (ref 38–126)
ALT: 19 U/L (ref 14–54)
ANION GAP: 11 (ref 5–15)
AST: 31 U/L (ref 15–41)
BILIRUBIN TOTAL: 0.7 mg/dL (ref 0.3–1.2)
BUN: 17 mg/dL (ref 6–20)
CALCIUM: 8.6 mg/dL — AB (ref 8.9–10.3)
CO2: 27 mmol/L (ref 22–32)
CREATININE: 0.69 mg/dL (ref 0.44–1.00)
Chloride: 97 mmol/L — ABNORMAL LOW (ref 101–111)
GFR calc non Af Amer: >60 mL/min (ref >60)
GLUCOSE: 171 mg/dL — AB (ref 65–99)
Potassium: 3.5 mmol/L (ref 3.5–5.1)
SODIUM: 135 mmol/L (ref 135–145)
TOTAL PROTEIN: 6.9 g/dL (ref 6.5–8.1)

## 2016-02-27 LAB — CBC
HEMATOCRIT: 42.8 % (ref 35.0–47.0)
HEMOGLOBIN: 14.5 g/dL (ref 12.0–16.0)
MCH: 28.4 pg (ref 26.0–34.0)
MCHC: 33.8 g/dL (ref 32.0–36.0)
MCV: 84 fL (ref 80.0–100.0)
Platelets: 262 10*3/uL (ref 150–440)
RBC: 5.1 MIL/uL (ref 3.80–5.20)
RDW: 16 % — ABNORMAL HIGH (ref 11.5–14.5)
WBC: 12.2 10*3/uL — ABNORMAL HIGH (ref 3.6–11.0)

## 2016-02-27 LAB — VALPROIC ACID LEVEL: Valproic Acid Lvl: 40 ug/mL — ABNORMAL LOW (ref 50.0–100.0)

## 2016-02-27 MED ORDER — PROPOFOL 10 MG/ML IV BOLUS
INTRAVENOUS | Status: AC | PRN
  Administered 2016-02-27: 100 mg via INTRAVENOUS

## 2016-02-27 MED ORDER — DIVALPROEX SODIUM 500 MG PO DR TAB: 500.0000 mg | DELAYED_RELEASE_TABLET | Freq: Once | ORAL | Status: DC

## 2016-02-27 MED ORDER — ETOMIDATE 2 MG/ML IV SOLN
12.0000 mg | Freq: Once | INTRAVENOUS | Status: AC
  Administered 2016-02-27: 12 mg via INTRAVENOUS
  Filled 2016-02-27: qty 10

## 2016-02-27 MED ORDER — FENTANYL CITRATE (PF) 100 MCG/2ML IJ SOLN
100.0000 ug | INTRAMUSCULAR | Status: DC | PRN
  Administered 2016-02-27: 100 ug via INTRAVENOUS
  Filled 2016-02-27 (×2): qty 2

## 2016-02-27 MED ORDER — PROPOFOL 10 MG/ML IV BOLUS: 0.5000 mg/kg | Freq: Once | INTRAVENOUS | Status: DC

## 2016-02-27 MED ORDER — FENTANYL CITRATE (PF) 100 MCG/2ML IJ SOLN: 100.0000 ug | Freq: Once | INTRAMUSCULAR | Status: DC

## 2016-02-27 MED ORDER — FENTANYL CITRATE (PF) 100 MCG/2ML IJ SOLN
100.0000 ug | INTRAMUSCULAR | Status: DC | PRN
  Administered 2016-02-27: 100 ug via INTRAVENOUS

## 2016-02-27 MED ORDER — LIDOCAINE HCL (PF) 1 % IJ SOLN
30.0000 mL | Freq: Once | INTRAMUSCULAR | Status: AC
  Administered 2016-02-27: 30 mL

## 2016-02-27 MED ORDER — DIVALPROEX SODIUM 500 MG PO DR TAB
500.0000 mg | DELAYED_RELEASE_TABLET | Freq: Once | ORAL | Status: AC
  Administered 2016-02-27: 500 mg via ORAL
  Filled 2016-02-27: qty 1

## 2016-02-27 MED ORDER — LIDOCAINE HCL (PF) 2 % IJ SOLN
30.0000 mL | Freq: Once | INTRAMUSCULAR | Status: DC
  Administered 2016-02-27: 30 mL
  Filled 2016-02-27: qty 30

## 2016-02-27 MED ORDER — NAPROXEN 500 MG PO TABS: 500.0000 mg | ORAL_TABLET | Freq: Two times a day (BID) | ORAL | 0 refills | Status: AC

## 2016-02-27 MED ORDER — ACETAMINOPHEN 500 MG PO TABS
1000.0000 mg | ORAL_TABLET | Freq: Once | ORAL | Status: AC
  Administered 2016-02-27: 1000 mg via ORAL

## 2016-02-27 MED ORDER — ACETAMINOPHEN 500 MG PO TABS
ORAL_TABLET | ORAL | Status: AC
  Administered 2016-02-27: 1000 mg via ORAL
  Filled 2016-02-27: qty 2

## 2016-02-27 MED ORDER — SODIUM CHLORIDE 0.9 % IV BOLUS (SEPSIS)
1000.0000 mL | Freq: Once | INTRAVENOUS | Status: AC
  Administered 2016-02-27: 1000 mL via INTRAVENOUS

## 2016-02-27 MED ORDER — LIDOCAINE HCL (PF) 1 % IJ SOLN
INTRAMUSCULAR | Status: AC
  Administered 2016-02-27: 30 mL
  Filled 2016-02-27: qty 30

## 2016-02-27 MED ORDER — PROPOFOL 1000 MG/100ML IV EMUL
INTRAVENOUS | Status: AC
  Filled 2016-02-27: qty 100

## 2016-02-27 MED ORDER — CYCLOBENZAPRINE HCL 10 MG PO TABS: 10.0000 mg | ORAL_TABLET | Freq: Three times a day (TID) | ORAL | 0 refills | Status: DC | PRN

## 2016-02-27 MED ORDER — HYDROCODONE-ACETAMINOPHEN 5-325 MG PO TABS: 1.0000 | ORAL_TABLET | ORAL | 0 refills | Status: DC | PRN

## 2016-02-27 NOTE — ED Triage Notes (Signed)
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 Family denies trauma. +Hx seizures. However patient is compliant with medications and has a history of petite mal / confusion with seizures.

## 2016-02-27 NOTE — ED Provider Notes (Signed)
[facility] Emergency Department Provider Note    First MD Initiated Contact with Patient 02/27/16 [phone]     (approximate)  I have reviewed the triage vital signs and the nursing notes.   HISTORY  Chief Complaint Seizures    HPI Dana Murillo is a 56 y.o. 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 Patient with history of seizures as well as on 1 g of Depakote daily. Has not had a grand mal seizure in over 20 years. States that she's been compliant with her medications. Has been taking cough suppressants. Seizure episode lasted 1-2 minutes. Was followed by postictal period. On previous episode she had had, location resulting in dislocation of the right shoulder. Is complaining of right shoulder pain again. At this time she denies any numbness or tingling. Other than right shoulder pain denies any discomfort.   Past Medical History:  Diagnosis Date  . Arthritis   . Epilepsia ([place])   . High cholesterol   . Hypertension    History reviewed. No pertinent family history. Past Surgical History:  Procedure Laterality Date  . ABDOMINAL HYSTERECTOMY    . BREAST BIOPSY Right 10/2012   BENIGN BREAST TISSUE  FOCAL COMPLEX SCLEROSING LESION  . BREAST SURGERY Right 2009  . REPLACEMENT TOTAL KNEE Bilateral 2013,2014   Dr Jonny Ruiz   Patient Active Problem List   Diagnosis Date Noted  . 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 Seizure disorder ([place]) 05/03/2012  . Microcalcifications of the breast 05/03/2012  . Essential hypertension, benign 05/03/2012      Prior to Admission medications   Medication Sig Start Date End Date Taking? Authorizing Provider  BREO ELLIPTA 100-25 MCG/INH AEPB  08/17/14   Historical Provider, MD  Calcium Carbonate-Vit D-Min (CALCIUM 1200 PO) Take by mouth.    Historical Provider, MD  carvedilol (COREG) 12.5 MG tablet Take 12.5 mg by mouth 2 (two) times daily with a meal.     Historical Provider, MD  diclofenac (VOLTAREN) 75 MG EC tablet Take 75 mg by mouth 2 (two) times daily.    Historical Provider, MD  divalproex (DEPAKOTE) 250 MG DR tablet Take 250 mg by mouth 3 (three) times daily. 500 mg at lunch    Historical Provider, MD  fish oil-omega-3 fatty acids 1000 MG capsule Take 2 g by mouth daily.    Historical Provider, MD  glucosamine-chondroitin 500-400 MG tablet Take 1 tablet by mouth 3 (three) times daily.    Historical Provider, MD  Multiple Vitamins-Minerals (MULTIVITAMIN WITH MINERALS) tablet Take 1 tablet by mouth daily.    Historical Provider, MD  predniSONE (STERAPRED UNI-PAK 48 TAB) 10 MG (48) TBPK tablet Take by mouth daily.    Historical Provider, MD  simvastatin (ZOCOR) 40 MG tablet Take 40 mg by mouth every evening.    Historical Provider, MD    Allergies Patient has no known allergies.    Social History Social History  Substance Use Topics  . Smoking status: Never Smoker  . Smokeless tobacco: Never Used  . Alcohol use No    Review of Systems Patient denies headaches, rhinorrhea, blurry vision, numbness, shortness of breath, chest pain, edema, cough, abdominal pain, nausea, vomiting, diarrhea, dysuria, fevers, rashes or hallucinations unless otherwise stated above in HPI. ____________________________________________   PHYSICAL EXAM:  VITAL SIGNS: Vitals:   02/27/16 0916  BP: (!) 152/117  Pulse: (!) 144  Resp: 20  Temp: 98 F (36.7  C)    Constitutional: Alert and oriented. Well appearing and in no acute distress. Eyes: Conjunctivae are normal. PERRL. EOMI. Head: Atraumatic. Nose: No congestion/rhinnorhea. Mouth/Throat: Mucous membranes are moist.  Oropharynx non-erythematous. Neck: No stridor. Painless ROM. No cervical spine tenderness to palpation Hematological/Lymphatic/Immunilogical: No cervical lymphadenopathy. Cardiovascular: tachycardic,  Irregularly irregular rhythm. Grossly normal heart sounds.  Good peripheral  circulation. Respiratory: Normal respiratory effort.  No retractions. Lungs CTAB. Gastrointestinal: Soft and nontender. No distention. No abdominal bruits. No CVA tenderness. Genitourinary:  Musculoskeletal: No lower extremity tenderness nor edema.  No joint effusions.  Subtle right shoulder deformity.  NV intact distally Neurologic:  Normal speech and language. No gross focal neurologic deficits are appreciated. No gait instability. Skin:  Skin is warm, dry and intact. No rash noted. Psychiatric: Mood and affect are normal. Speech and behavior are normal.  ____________________________________________   LABS (all labs ordered are listed, but only abnormal results are displayed)  Results for orders placed or performed during the hospital encounter of 02/27/16 (from the past 24 hour(s))  CBC     Status: Abnormal   Collection Time: 02/27/16  9:51 AM  Result Value Ref Range   WBC 12.2 (H) 3.6 - 11.0 K/uL   RBC 5.10 3.80 - 5.20 MIL/uL   Hemoglobin 14.5 12.0 - 16.0 g/dL   HCT 42.8 35.0 - 47.0 %   MCV 84.0 80.0 - 100.0 fL   MCH 28.4 26.0 - 34.0 pg   MCHC 33.8 32.0 - 36.0 g/dL   RDW 16.0 (H) 11.5 - 14.5 %   Platelets 262 150 - 440 K/uL  Comprehensive metabolic panel     Status: Abnormal   Collection Time: 02/27/16  9:51 AM  Result Value Ref Range   Sodium 135 135 - 145 mmol/L   Potassium 3.5 3.5 - 5.1 mmol/L   Chloride 97 (L) 101 - 111 mmol/L   CO2 27 22 - 32 mmol/L   Glucose, Bld 171 (H) 65 - 99 mg/dL   BUN 17 6 - 20 mg/dL   Creatinine, Ser 0.69 0.44 - 1.00 mg/dL   Calcium 8.6 (L) 8.9 - 10.3 mg/dL   Total Protein 6.9 6.5 - 8.1 g/dL   Albumin 3.4 (L) 3.5 - 5.0 g/dL   AST 31 15 - 41 U/L   ALT 19 14 - 54 U/L   Alkaline Phosphatase 49 38 - 126 U/L   Total Bilirubin 0.7 0.3 - 1.2 mg/dL   GFR calc non Af Amer >60 >60 mL/min   GFR calc Af Amer >60 >60 mL/min   Anion gap 11 5 - 15  Valproic acid level     Status: Abnormal   Collection Time: 02/27/16  9:51 AM  Result Value Ref Range     Valproic Acid Lvl 40 (L) 50.0 - 100.0 ug/mL   ____________________________________________  EKG  My review and personal interpretation at Time: 9:14   Indication: seizure  Rate: 140  Rhythm: afib with rvr  Axis: normal Other: rapid afib, nonspecific changes   My review and personal interpretation at Time: 14:41   Indication: seizure  Rate: 70  Rhythm: sinus Axis: normal Other: normal intervals, no STEMI ____________________________________________  RADIOLOGY  I personally reviewed all radiographic images ordered to evaluate for the above acute complaints and reviewed radiology reports and findings.  These findings were personally discussed with the patient.  Please see medical record for radiology report.  ____________________________________________   PROCEDURES  Procedure(s) performed:  ORTHOPEDIC INJURY TREATMENT Date/Time: 02/27/2016 3:18 PM Performed  by: Merlyn Lot Authorized by: Merlyn Lot  Consent: Written consent obtained. Risks and benefits: risks, benefits and alternatives were discussed Consent given by: patient Injury location: shoulder Location details: right shoulder Injury type: dislocation Dislocation type: anterior Hill-Sachs deformity: yes Chronicity: new Pre-procedure distal perfusion: normal Pre-procedure neurological function: normal Pre-procedure range of motion: reduced  Anesthesia: Local anesthesia used: yes Local Anesthetic: lidocaine 1% without epinephrine Anesthetic total: 15 mL  Sedation: Patient sedated: yes Sedation type: moderate (conscious) sedation Sedatives: etomidate and propofol Analgesia: fentanyl Manipulation performed: yes Reduction method: external rotation, scapular manipulation, traction and counter traction and Stimson maneuver Reduction successful: yes X-ray confirmed reduction: yes Immobilization: sling       Critical Care performed:  no ____________________________________________   INITIAL IMPRESSION / ASSESSMENT AND PLAN / ED COURSE  Pertinent labs & imaging results that were available during my care of the patient were reviewed by me and considered in my medical decision making (see chart for details).  DDX: seizure, electrolye abn, dehydration, fracture, dislocation, contusion  LADORA MUSELLA is a 56 y.o. who presents to the ED with history of seizures presents with an episode of seizure. Appears consistent with previous episodes that she is not had a seizure in like this in over 20 years. Patient is on Depakote and her Depakote level is low. She has no focal neuro deficits at this time therefore do not feel that neuroimaging clinically indicated. Patient initially shows evidence of A. fib with RVR. Denies any chest pain. This is a new diagnosis. Will treat pain and provide IV fluids. Patient with evidence of right shoulder dislocation as described above. Will require procedure sedation for reduction.  The patient will be placed on continuous pulse oximetry and telemetry for monitoring.  Laboratory evaluation will be sent to evaluate for the above complaints.     Clinical Course as of Feb 27 1512  Fri Feb 27, 2016  1421 Shoulder reduction successful.   [PR]  [id number] Patient remains hemodynamic stable. Repeat EKG shows sinus rhythm with no ischemic changes. Patient stable for close outpatient follow-up.  Patient was able to tolerate PO and was able to ambulate with a steady gait.  Have provided instructions for follow-up with PCP as well as neurology and orthopedics.   Have discussed with the patient and available family all diagnostics and treatments performed thus far and all questions were answered to the best of my ability. The patient demonstrates understanding and agreement with plan. [PR]    Clinical Course User Index [PR] Merlyn Lot, MD     ____________________________________________   FINAL CLINICAL  IMPRESSION(S) / ED DIAGNOSES  Final diagnoses:  Shoulder pain, acute  Seizure ([place])  Anterior shoulder dislocation, right, initial encounter  Atrial fibrillation with rapid ventricular response ([place])      NEW MEDICATIONS STARTED DURING THIS VISIT:  New Prescriptions   No medications on file     Note:  This document was prepared using Dragon voice recognition software and may include unintentional dictation errors.    Merlyn Lot, MD 02/27/16 [phone]

## 2016-02-27 NOTE — ED Notes (Signed)
Pt given cup of water. Mouth cleaned by this tech.

## 2016-02-27 NOTE — Discharge Instructions (Signed)
Please follow-up with Dr. Hall Busing regarding changing in your Depakote levels. Follow-up with neurology as needed. Keep arm in sling. He may use muscle relaxants and anti-inflammatories to help with pain. Return should he have any worsening symptoms. Follow-up with orthopedics.

## 2018-10-05 ENCOUNTER — Telehealth: Payer: Self-pay | Admitting: *Deleted

## 2018-10-05 ENCOUNTER — Ambulatory Visit: Payer: BC Managed Care – PPO | Admitting: General Surgery

## 2018-10-05 ENCOUNTER — Other Ambulatory Visit: Payer: Self-pay

## 2018-10-05 ENCOUNTER — Encounter: Payer: Self-pay | Admitting: General Surgery

## 2018-10-05 VITALS — BP 150/92 | HR 89 | Temp 98.1°F | Ht 62.0 in | Wt 277.0 lb

## 2018-10-05 DIAGNOSIS — N6001 Solitary cyst of right breast: Secondary | ICD-10-CM | POA: Diagnosis not present

## 2018-10-05 MED ORDER — CEPHALEXIN 500 MG PO CAPS: 500.0000 mg | ORAL_CAPSULE | Freq: Four times a day (QID) | ORAL | 0 refills | Status: AC

## 2018-10-05 NOTE — Telephone Encounter (Signed)
Patient called and stated that she went to pick up her medication at the pharmacy ([facility]) and she stated that they have not received anything.

## 2018-10-05 NOTE — Telephone Encounter (Signed)
Called pharmacy and gave verbal prescription order. Patient notified that they now have the prescription.

## 2018-10-05 NOTE — Progress Notes (Signed)
Patient ID: Dana Murillo, female   DOB: 1960-06-19, 58 y.o.   MRN: [id number]  Chief Complaint  Patient presents with  . New Patient (Initial Visit)    breast cyst    HPI ARBIE Murillo is a 58 y.o. female.   She is here today as a self-referral.  Her primary care provider is Dr. Benita Stabile.  She says that she first noticed the cyst on the inferior aspect of her breast in October 2019.  At that time, she says it was maybe a quarter of an inch in diameter.  She saw her primary care provider who told her it was a "benign something" (she cannot remember the exact term he used).  She said it stayed the same until about a week ago.  At that point, it turned red and became painful.  She noticed purulent oozing from the site.  She did not have any fevers or chills.  No nausea or vomiting.  She is not taking any antibiotics at this time.  She denies any prior history of similar cysts or abscesses in the past.   Past Medical History:  Diagnosis Date  . Arthritis   . Epilepsia ([place])   . High cholesterol   . Hypertension     Past Surgical History:  Procedure Laterality Date  . ABDOMINAL HYSTERECTOMY    . BREAST BIOPSY Right 10/2012   BENIGN BREAST TISSUE  FOCAL COMPLEX SCLEROSING LESION  . BREAST SURGERY Right 2009  . REPLACEMENT TOTAL KNEE Bilateral 2013,2014   Dr Jonny Ruiz    Family History  Problem Relation Age of Onset  . Diabetes Mother   . Heart disease Father   . Diabetes Father     Social History Social History   Tobacco Use  . Smoking status: Never Smoker  . Smokeless tobacco: Never Used  Substance Use Topics  . Alcohol use: No  . Drug use: No    No Known Allergies  Current Outpatient Medications  Medication Sig Dispense Refill  . BREO ELLIPTA 100-25 MCG/INH AEPB Inhale 1 puff into the lungs daily.     . Calcium Carbonate-Vit D-Min (CALCIUM 1200 PO) Take 1 tablet by mouth daily.     . carvedilol (COREG) 25 MG tablet Take 25 mg by mouth 2 (two) times daily with a  meal.     . diclofenac (VOLTAREN) 50 MG EC tablet Take 50 mg by mouth 2 (two) times daily.     . divalproex (DEPAKOTE) 250 MG DR tablet Take 250-500 mg by mouth 3 (three) times daily. Take 250 at breakfast , 500 at lunch, and 500 in the evening    . fish oil-omega-3 fatty acids 1000 MG capsule Take 4 g by mouth daily.     Dana Murillo glucosamine-chondroitin 500-400 MG tablet Take 1 tablet by mouth daily.     . Multiple Vitamins-Minerals (MULTIVITAMIN WITH MINERALS) tablet Take 1 tablet by mouth daily.    . simvastatin (ZOCOR) 40 MG tablet Take 40 mg by mouth every evening.    . cephALEXin (KEFLEX) 500 MG capsule Take 1 capsule (500 mg total) by mouth 4 (four) times daily for 7 days. 28 capsule 0   No current facility-administered medications for this visit.     Review of Systems Review of Systems  All other systems reviewed and are negative.   Blood pressure (!) 150/92, pulse 89, temperature 98.1 F (36.7 C), height 5\' 2"  (1.575 m), weight 277 lb (125.6 kg), SpO2 97 %. Today's Vitals  10/05/18 1337  BP: (!) 150/92  Pulse: 89  Temp: 98.1 F (36.7 C)  SpO2: 97%  Weight: 277 lb (125.6 kg)  Height: 5\' 2"  (1.575 m)   Body mass index is 50.66 kg/m.  Physical Exam Physical Exam Vitals signs reviewed. Exam conducted with a chaperone present.  Constitutional:      General: She is not in acute distress.    Appearance: She is obese.  HENT:     Head: Normocephalic and atraumatic.     Nose:     Comments: Covered with a mask secondary to COVID-19 precautions    Mouth/Throat:     Comments: Covered with a mask secondary to COVID-19 precautions Eyes:     General: No scleral icterus.       Right eye: No discharge.        Left eye: No discharge.     Conjunctiva/sclera: Conjunctivae normal.  Neck:     Musculoskeletal: Normal range of motion. No neck rigidity.     Comments: Exam is limited due to neck circumference and mass. Cardiovascular:     Rate and Rhythm: Normal rate and regular rhythm.      Pulses: Normal pulses.     Heart sounds: Normal heart sounds.  Pulmonary:     Effort: Pulmonary effort is normal.     Breath sounds: Normal breath sounds.  Chest:       Comments: There is a 3 cm area of erythema very deep on the inferior aspect of the breast, nearly at the inframammary fold.  There is skin excoriation present.  There is a central area with some purulent discharge present.  It is quite tender to palpation. Abdominal:     Palpations: Abdomen is soft.     Comments: Protuberant, consistent with her level of obesity.  Genitourinary:    Comments: Deferred Musculoskeletal:     Right lower leg: Edema present.     Left lower leg: Edema present.     Comments: Scars over each knee, consistent with her history of bilateral total knee arthroplasty.  She has 1+ pitting edema to the knees on both lower extremities.  Lymphadenopathy:     Cervical: No cervical adenopathy.  Skin:    General: Skin is warm and dry.  Neurological:     General: No focal deficit present.     Mental Status: She is alert and oriented to person, place, and time.  Psychiatric:        Mood and Affect: Mood normal.        Behavior: Behavior normal.     Data Reviewed There are no recent relevant data to review.  Assessment and Plan: This is a 58 year old woman with what appears to be a superficial cutaneous abscess on her right breast.  It may be a sebaceous cyst, however it is exceptionally inflamed and infected.  I performed an incision and drainage today in clinic.  Cultures were taken.  She should perform twice daily dressing changes.  Keflex was initiated.  I will see her back in 1 week for reevaluation.   Incision and Drainage Procedure Note  Pre-operative Diagnosis: cutaneous abscess of breast  Post-operative Diagnosis: same  Indications: painful, erythematous, fluctuant lesion on breast  Anesthesia: 1% lidocaine with epinephrine  Procedure Details  The procedure, risks and  complications have been discussed in detail (including, but not limited to airway compromise, infection, bleeding) with the patient, and the patient has signed consent to the procedure.  The skin was sterilely prepped and  draped over the affected area in the usual fashion. After adequate local anesthesia, I&D with a #11 blade was performed on the right breast. Purulent drainage: present; wound packed with iodoform gauze. Instructions for dressing changes given. The patient was observed until stable.  Findings: Frank pus present  EBL: less than 5 cc's  Drains: none  Condition: Tolerated procedure well   Complications: none.    Fredirick Maudlin 10/05/2018, 2:25 PM

## 2018-10-05 NOTE — Patient Instructions (Signed)
Tomorrow morning remove packing and shower area letting the water run over it. Pat dry and repack all the way to the bottom of the cavity. Pack the area twice a day.   Fill and complete your antibiotics.  Follow up in one week with Dr Celine Ahr

## 2018-10-10 ENCOUNTER — Other Ambulatory Visit: Payer: Self-pay

## 2018-10-10 ENCOUNTER — Ambulatory Visit (INDEPENDENT_AMBULATORY_CARE_PROVIDER_SITE_OTHER): Payer: BC Managed Care – PPO | Admitting: General Surgery

## 2018-10-10 ENCOUNTER — Encounter: Payer: Self-pay | Admitting: General Surgery

## 2018-10-10 VITALS — BP 150/92 | HR 98 | Temp 97.7°F | Ht 62.0 in | Wt 277.0 lb

## 2018-10-10 DIAGNOSIS — N6001 Solitary cyst of right breast: Secondary | ICD-10-CM | POA: Diagnosis not present

## 2018-10-10 NOTE — Patient Instructions (Signed)
Start using Gold bond powder under the breast.   Continue packing after shower.   Follow up next week.

## 2018-10-10 NOTE — Progress Notes (Signed)
Dana Murillo is here for recheck of her wound.  She had incision and drainage of a small subcutaneous breast abscess last week in clinic.  Culture data are not yet available, but she has been taking the Keflex as prescribed and performing dressing changes at home.  She states that the dressing changes are just a little bit tender as the packing goes in, otherwise the pain, tenderness, and inflammation have resolved.  No fevers or chills.  Today's Vitals   10/10/18 1417  BP: (!) 150/92  Pulse: 98  Temp: 97.7 F (36.5 C)  SpO2: 98%  Weight: 277 lb (125.6 kg)  Height: 5\' 2"  (1.575 m)   Body mass index is 50.66 kg/m. Focused examination: There is a Band-Aid covering the site.  This was removed.  The skin surrounding the incision is somewhat moist and erythematous, concerning for cutaneous candidiasis.  The packing was removed.  There is no purulent drainage present.  The wound is closing in nicely with good granulation tissue at the base.  The surrounding erythema that was present prior to I&D has resolved.  I repacked the wound with a small piece of saline moistened gauze.  Impression and plan: Dana Murillo is doing well after I&D of a subcutaneous breast abscess.  She should continue to shower daily and let warm soapy water flow over the wound.  Continue dressing changes daily with saline moistened gauze or packing strips.  Complete the course of antibiotics.  Return to clinic next week for reevaluation of her wound.

## 2018-10-14 LAB — ANAEROBIC AND AEROBIC CULTURE

## 2018-10-17 ENCOUNTER — Ambulatory Visit (INDEPENDENT_AMBULATORY_CARE_PROVIDER_SITE_OTHER): Payer: BC Managed Care – PPO | Admitting: Physician Assistant

## 2018-10-17 ENCOUNTER — Other Ambulatory Visit: Payer: Self-pay

## 2018-10-17 ENCOUNTER — Encounter: Payer: Self-pay | Admitting: Physician Assistant

## 2018-10-17 VITALS — BP 160/90 | HR 89 | Temp 97.5°F | Ht 62.0 in | Wt 276.0 lb

## 2018-10-17 DIAGNOSIS — N6001 Solitary cyst of right breast: Secondary | ICD-10-CM

## 2018-10-17 DIAGNOSIS — Z09 Encounter for follow-up examination after completed treatment for conditions other than malignant neoplasm: Secondary | ICD-10-CM

## 2018-10-17 MED ORDER — FLUCONAZOLE 100 MG PO TABS: 100.0000 mg | ORAL_TABLET | Freq: Every day | ORAL | 0 refills | Status: DC

## 2018-10-17 NOTE — Progress Notes (Signed)
[facility]OP OFFICE VISIT  10/17/2018  HPI: Dana Murillo is a 58 y.o. female 12 days s/p incision and drainage or right breast abscess with Dr Celine Ahr  Overall, she reports that she is doing well. She is barely packing the wound anymore. No fever or chills. No drainage. Biggest issue is candidal infection in infra-mammary folds. This occurred in the past. She typically uses Goldbond powder to keep this dry but stopped secondary to recent I&D.    Vital signs: BP (!) 160/90   Pulse 89   Temp (!) 97.5 F (36.4 C)   Ht 5\' 2"  (1.575 m)   Wt 276 lb (125.2 kg)   SpO2 95%   BMI 50.48 kg/m    Physical Exam: Constitutional: Well appearing female, NAD Right Breast: At the 6 o'clock position of the right breast there is a well healing 0.5 x 0.5 x 0.5 cm previous I&D site, no erythema, no drainage. There is candidal infection to the infra-mammary folds of the bilateral breast  Assessment/Plan: This is a 57 y.o. female 12 days s/p incision and drainage of right breast abscess   - pain control prn  - dry dressing changes daily until wound closes  - okay to shower still  - Diflucan for candidal infection + powder to keep area dry  - Patient offered follow up in 2 weeks for final wound check but she prefers to follow up prn which I feel is reasonable at this time.   -- Edison Simon, PA-C [facility] 10/17/2018, 3:40 PM [phone] M-F: 7am - 4pm

## 2018-10-17 NOTE — Patient Instructions (Signed)
May use a dry gauze and bandaid until the area heals.  Use gold bond powder.  We will send in a prescription for Diflucan.   Follow-up with our office as needed.  Please call and ask to speak with a nurse if you develop questions or concerns.

## 2018-10-19 ENCOUNTER — Ambulatory Visit: Payer: BC Managed Care – PPO | Admitting: General Surgery

## 2018-10-27 ENCOUNTER — Telehealth: Payer: Self-pay | Admitting: General Surgery

## 2018-10-27 ENCOUNTER — Other Ambulatory Visit: Payer: Self-pay | Admitting: Physician Assistant

## 2018-10-27 MED ORDER — NYSTATIN 100000 UNIT/GM EX POWD: Freq: Four times a day (QID) | CUTANEOUS | 0 refills | Status: DC

## 2018-10-27 NOTE — Telephone Encounter (Signed)
Patient is calling said she was taken medicine for a yeast infection and patient said the infection has gotten worse. Please call patient and advise.

## 2018-10-27 NOTE — Telephone Encounter (Signed)
Per Edison Simon [facility] will prescribe Nystatin Powder for the patient and she will need to make a follow up appointment with her PCP. Patient notified of such and will start Nystatin power and call PCP for follow up.

## 2019-02-12 ENCOUNTER — Ambulatory Visit: Payer: BC Managed Care – PPO | Attending: Internal Medicine

## 2019-02-12 DIAGNOSIS — Z20822 Contact with and (suspected) exposure to covid-19: Secondary | ICD-10-CM

## 2019-02-13 LAB — NOVEL CORONAVIRUS, NAA: SARS-CoV-2, NAA: NOT DETECTED

## 2019-02-16 ENCOUNTER — Other Ambulatory Visit: Payer: BC Managed Care – PPO

## 2019-03-31 ENCOUNTER — Ambulatory Visit: Payer: BC Managed Care – PPO | Attending: Internal Medicine

## 2019-03-31 DIAGNOSIS — Z23 Encounter for immunization: Secondary | ICD-10-CM

## 2019-03-31 NOTE — Progress Notes (Signed)
   Covid-19 Vaccination Clinic  Name:  Dana Murillo    MRN: [id number] DOB: 10-14-1960  03/31/2019  [name] was observed post Covid-19 immunization for 15 minutes without incidence. She was provided with Vaccine Information Sheet and instruction to access the V-Safe system.   [name] was instructed to call 911 with any severe reactions post vaccine: Marland Kitchen Difficulty breathing  . Swelling of your face and throat  . A fast heartbeat  . A bad rash all over your body  . Dizziness and weakness    Immunizations Administered    Name Date Dose VIS Date Route   Moderna COVID-19 Vaccine 03/31/2019  9:19 AM 0.5 mL 01/02/2019 Intramuscular   Manufacturer: Moderna   Lot: [id number]   [id number]

## 2019-04-11 ENCOUNTER — Telehealth: Payer: Self-pay

## 2019-04-11 NOTE — Telephone Encounter (Signed)
Phone call to pt.  Confirmed that appt. has been made for 2nd dose of Moderna Vaccine, on 04/28/19 @ 4:15 PM.  Advised pt. that the appointment was made in error,  indicating [place] brand, and this has since been corrected.  Encouraged the pt. to bring her card from 1st dose of Moderna that was given on 2/27, to the next appt. Also encouraged patient to confirm, at 2nd vaccine appt., that the nurse has prepared the "Moderna" vaccine, prior to receiving the injection.  Pt. Verb. Understanding.  Agreed with plan.

## 2019-04-28 ENCOUNTER — Ambulatory Visit: Payer: BC Managed Care – PPO | Attending: Internal Medicine

## 2019-04-28 ENCOUNTER — Ambulatory Visit: Payer: BC Managed Care – PPO

## 2019-04-28 DIAGNOSIS — Z23 Encounter for immunization: Secondary | ICD-10-CM

## 2019-04-28 NOTE — Progress Notes (Signed)
   Covid-19 Vaccination Clinic  Name:  ERION PETRASH    MRN: [id number] DOB: 12-29-1960  04/28/2019  Ms. Maro was observed post Covid-19 immunization for 15 minutes without incident. She was provided with Vaccine Information Sheet and instruction to access the V-Safe system.   Dana Murillo was instructed to call 911 with any severe reactions post vaccine: Marland Kitchen Difficulty breathing  . Swelling of face and throat  . A fast heartbeat  . A bad rash all over body  . Dizziness and weakness   Immunizations Administered    Name Date Dose VIS Date Route   Moderna COVID-19 Vaccine 04/28/2019  2:04 PM 0.5 mL 01/02/2019 Intramuscular   Manufacturer: Levan Hurst   [id number]   [id number]

## 2019-05-02 ENCOUNTER — Ambulatory Visit: Payer: BC Managed Care – PPO

## 2019-09-07 ENCOUNTER — Emergency Department: Payer: BC Managed Care – PPO

## 2019-09-07 ENCOUNTER — Emergency Department
Admission: EM | Admit: 2019-09-07 | Discharge: 2019-09-07 | Disposition: A | Discharge status: Home / Self Care | Payer: BC Managed Care – PPO | Attending: Student in an Organized Health Care Education/Training Program | Admitting: Student in an Organized Health Care Education/Training Program

## 2019-09-07 ENCOUNTER — Encounter: Payer: Self-pay | Admitting: Emergency Medicine

## 2019-09-07 DIAGNOSIS — Z79899 Other long term (current) drug therapy: Secondary | ICD-10-CM | POA: Diagnosis not present

## 2019-09-07 DIAGNOSIS — R42 Dizziness and giddiness: Secondary | ICD-10-CM

## 2019-09-07 DIAGNOSIS — I10 Essential (primary) hypertension: Secondary | ICD-10-CM | POA: Insufficient documentation

## 2019-09-07 DIAGNOSIS — Z96653 Presence of artificial knee joint, bilateral: Secondary | ICD-10-CM | POA: Diagnosis not present

## 2019-09-07 LAB — URINALYSIS, COMPLETE (UACMP) WITH MICROSCOPIC
Bilirubin Urine: NEGATIVE
Glucose, UA: NEGATIVE mg/dL
Hgb urine dipstick: NEGATIVE
Ketones, ur: NEGATIVE mg/dL
Leukocytes,Ua: NEGATIVE
Nitrite: NEGATIVE
Protein, ur: NEGATIVE mg/dL
Specific Gravity, Urine: 1.013 (ref 1.005–1.030)
pH: 6 (ref 5.0–8.0)

## 2019-09-07 LAB — CBC
HCT: 41.1 % (ref 36.0–46.0)
Hemoglobin: 14.2 g/dL (ref 12.0–15.0)
MCH: 29.2 pg (ref 26.0–34.0)
MCHC: 34.5 g/dL (ref 30.0–36.0)
MCV: 84.4 fL (ref 80.0–100.0)
Platelets: 190 10*3/uL (ref 150–400)
RBC: 4.87 MIL/uL (ref 3.87–5.11)
RDW: 16.6 % — ABNORMAL HIGH (ref 11.5–15.5)
WBC: 9 10*3/uL (ref 4.0–10.5)
nRBC: 0 % (ref 0.0–0.2)

## 2019-09-07 LAB — BASIC METABOLIC PANEL
Anion gap: 11 (ref 5–15)
BUN: 20 mg/dL (ref 8–23)
CO2: 26 mmol/L (ref 22–32)
Calcium: 8.7 mg/dL — ABNORMAL LOW (ref 8.9–10.3)
Chloride: 101 mmol/L (ref 98–111)
Creatinine, Ser: 0.71 mg/dL (ref 0.44–1.00)
GFR calc Af Amer: >60 mL/min (ref >60)
GFR calc non Af Amer: >60 mL/min (ref >60)
Glucose, Bld: 156 mg/dL — ABNORMAL HIGH (ref 70–99)
Potassium: 3.7 mmol/L (ref 3.5–5.1)
Sodium: 138 mmol/L (ref 135–145)

## 2019-09-07 LAB — TROPONIN I (HIGH SENSITIVITY): Troponin I (High Sensitivity): 7 ng/L (ref <18)

## 2019-09-07 MED ORDER — ONDANSETRON 4 MG PO TBDP: 4.0000 mg | ORAL_TABLET | Freq: Once | ORAL | Status: DC

## 2019-09-07 MED ORDER — MECLIZINE HCL 25 MG PO TABS: 25.0000 mg | ORAL_TABLET | Freq: Three times a day (TID) | ORAL | 0 refills | Status: DC | PRN

## 2019-09-07 NOTE — ED Notes (Signed)
Patient transported to MRI 

## 2019-09-07 NOTE — ED Notes (Signed)
VS obtained by this RN. Pt visualized in NAD at this time. Pt visualized sitting in wheelchair in lobby at this time.

## 2019-09-07 NOTE — ED Notes (Signed)
Patient returned from MRI.

## 2019-09-07 NOTE — ED Provider Notes (Signed)
[facility] Emergency Department Provider Note    First MD Initiated Contact with Patient 09/07/19 1247     (approximate)  I have reviewed the triage vital signs and the nursing notes.   HISTORY  Chief Complaint Dizziness    HPI Dana Murillo is a 59 y.o. female presents to the ER for evaluation of sudden onset of dizziness morning when she got up and turned her head.  States that symptoms lasted roughly an hour.  Felt the room was spinning around.  No associated numbness or tingling.  No chest pain or shortness of breath.  Never had pain like this before.  No fevers.  Denies any headache.    Past Medical History:  Diagnosis Date  . Arthritis   . Epilepsia ([place])   . High cholesterol   . Hypertension    Family History  Problem Relation Age of Onset  . Diabetes Mother   . Heart disease Father   . Diabetes Father    Past Surgical History:  Procedure Laterality Date  . ABDOMINAL HYSTERECTOMY    . BREAST BIOPSY Right 10/2012   BENIGN BREAST TISSUE  FOCAL COMPLEX SCLEROSING LESION  . BREAST SURGERY Right 2009  . REPLACEMENT TOTAL KNEE Bilateral 2013,2014   Dr Jonny Ruiz   Patient Active Problem List   Diagnosis Date Noted  . 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 Seizure disorder ([place]) 05/03/2012  . Microcalcifications of the breast 05/03/2012  . Essential hypertension, benign 05/03/2012      Prior to Admission medications   Medication Sig Start Date End Date Taking? Authorizing Provider  BREO ELLIPTA 100-25 MCG/INH AEPB Inhale 1 puff into the lungs daily.  08/17/14   [provider]  Calcium Carbonate-Vit D-Min (CALCIUM 1200 PO) Take 1 tablet by mouth daily.     [provider]  carvedilol (COREG) 25 MG tablet Take 25 mg by mouth 2 (two) times daily with a meal.     [provider]  diclofenac (VOLTAREN) 50 MG EC tablet Take 50 mg by mouth 2 (two) times daily.     [provider]  divalproex (DEPAKOTE) 250  MG DR tablet Take 250-500 mg by mouth 3 (three) times daily. Take 250 at breakfast , 500 at lunch, and 500 in the evening    [provider]  fish oil-omega-3 fatty acids 1000 MG capsule Take 4 g by mouth daily.     [provider]  fluconazole (DIFLUCAN) 100 MG tablet Take 1 tablet (100 mg total) by mouth daily. Take 1 tablet every 3 days for two doses 10/17/18   Tylene Fantasia, PA-C  glucosamine-chondroitin 500-400 MG tablet Take 1 tablet by mouth daily.     [provider]  Multiple Vitamins-Minerals (MULTIVITAMIN WITH MINERALS) tablet Take 1 tablet by mouth daily.    [provider]  nystatin (MYCOSTATIN/NYSTOP) powder Apply topically 4 (four) times daily. 10/27/18   Tylene Fantasia, PA-C  simvastatin (ZOCOR) 40 MG tablet Take 40 mg by mouth every evening.    [provider]    Allergies Patient has no known allergies.    Social History Social History   Tobacco Use  . Smoking status: Never Smoker  . Smokeless tobacco: Never Used  Substance Use Topics  . Alcohol use: No  . Drug use: No    Review of Systems Patient denies headaches, rhinorrhea, blurry vision, numbness, shortness of breath, chest pain, edema, cough, abdominal pain, nausea, vomiting, diarrhea, dysuria, fevers,  rashes or hallucinations unless otherwise stated above in HPI. ____________________________________________   PHYSICAL EXAM:  VITAL SIGNS: Vitals:   09/07/19 0859 09/07/19 1141  BP: (!) 168/93 (!) 165/94  Pulse: 66 67  Resp: 20 19  Temp:    SpO2: 97% 98%    Constitutional: Alert and oriented.  Eyes: Conjunctivae are normal.  Head: Atraumatic. Nose: No congestion/rhinnorhea. Mouth/Throat: Mucous membranes are moist.   Neck: No stridor. Painless ROM.  Cardiovascular: Normal rate, regular rhythm. Grossly normal heart sounds.  Good peripheral circulation. Respiratory: Normal respiratory effort.  No retractions. Lungs CTAB. Gastrointestinal: Soft and  nontender. No distention. No abdominal bruits. No CVA tenderness. Genitourinary:  Musculoskeletal: No lower extremity tenderness nor edema.  No joint effusions. Neurologic:  CN- intact.  No facial droop, Normal FNF.  Normal heel to shin.  Sensation intact bilaterally. Normal speech and language. No gross focal neurologic deficits are appreciated. No gait instability. Benign hints exam. Skin:  Skin is warm, dry and intact. No rash noted. Psychiatric: Mood and affect are normal. Speech and behavior are normal.  ____________________________________________   LABS (all labs ordered are listed, but only abnormal results are displayed)  Results for orders placed or performed during the hospital encounter of 09/07/19 (from the past 24 hour(s))  Basic metabolic panel     Status: Abnormal   Collection Time: 09/07/19  5:51 AM  Result Value Ref Range   Sodium 138 135 - 145 mmol/L   Potassium 3.7 3.5 - 5.1 mmol/L   Chloride 101 98 - 111 mmol/L   CO2 26 22 - 32 mmol/L   Glucose, Bld 156 (H) 70 - 99 mg/dL   BUN 20 8 - 23 mg/dL   Creatinine, Ser 0.71 0.44 - 1.00 mg/dL   Calcium 8.7 (L) 8.9 - 10.3 mg/dL   GFR calc non Af Amer >60 >60 mL/min   GFR calc Af Amer >60 >60 mL/min   Anion gap 11 5 - 15  CBC     Status: Abnormal   Collection Time: 09/07/19  5:51 AM  Result Value Ref Range   WBC 9.0 4.0 - 10.5 K/uL   RBC 4.87 3.87 - 5.11 MIL/uL   Hemoglobin 14.2 12.0 - 15.0 g/dL   HCT 41.1 36 - 46 %   MCV 84.4 80.0 - 100.0 fL   MCH 29.2 26.0 - 34.0 pg   MCHC 34.5 30.0 - 36.0 g/dL   RDW 16.6 (H) 11.5 - 15.5 %   Platelets 190 150 - 400 K/uL   nRBC 0.0 0.0 - 0.2 %  Urinalysis, Complete w Microscopic     Status: Abnormal   Collection Time: 09/07/19  5:51 AM  Result Value Ref Range   Color, Urine STRAW (A) YELLOW   APPearance CLEAR (A) CLEAR   Specific Gravity, Urine 1.013 1.005 - 1.030   pH 6.0 5.0 - 8.0   Glucose, UA NEGATIVE NEGATIVE mg/dL   Hgb urine dipstick NEGATIVE NEGATIVE   Bilirubin  Urine NEGATIVE NEGATIVE   Ketones, ur NEGATIVE NEGATIVE mg/dL   Protein, ur NEGATIVE NEGATIVE mg/dL   Nitrite NEGATIVE NEGATIVE   Leukocytes,Ua NEGATIVE NEGATIVE   RBC / HPF 0-5 0 - 5 RBC/hpf   WBC, UA 0-5 0 - 5 WBC/hpf   Bacteria, UA RARE (A) NONE SEEN   Squamous Epithelial / LPF 0-5 0 - 5   Mucus PRESENT   Troponin I (High Sensitivity)     Status: None   Collection Time: 09/07/19  5:52 AM  Result Value Ref  Range   Troponin I (High Sensitivity) 7 <18 ng/L   ____________________________________________  EKG My review and personal interpretation at Time: 5:58   Indication: dizziness  Rate: 60  Rhythm: sinus Axis: normal Other: normal intervals, no stemi ____________________________________________  RADIOLOGY  I personally reviewed all radiographic images ordered to evaluate for the above acute complaints and reviewed radiology reports and findings.  These findings were personally discussed with the patient.  Please see medical record for radiology report.  ____________________________________________   PROCEDURES  Procedure(s) performed:  Procedures    Critical Care performed: no ____________________________________________   INITIAL IMPRESSION / ASSESSMENT AND PLAN / ED COURSE  Pertinent labs & imaging results that were available during my care of the patient were reviewed by me and considered in my medical decision making (see chart for details).   DDX: BPPV, CVA, mass, ICH, electrolyte abnormality, orthostasis  SHAUNTEE KARP is a 59 y.o. who presents to the ED with symptoms as described above.  Blood work as well as MRI ordered for above differential.  Presentation work-up consistent with BPPV.  Symptoms resolve.  Will give conservative management prescription and discussed signs and symptoms for which the patient should return to the ER     The patient was evaluated in Emergency Department today for the symptoms described in the history of present illness.  He/she was evaluated in the context of the global COVID-19 pandemic, which necessitated consideration that the patient might be at risk for infection with the SARS-CoV-2 virus that causes COVID-19. Institutional protocols and algorithms that pertain to the evaluation of patients at risk for COVID-19 are in a state of rapid change based on information released by regulatory bodies including the CDC and federal and state organizations. These policies and algorithms were followed during the patient's care in the ED.  As part of my medical decision making, I reviewed the following data within the [facility] notes reviewed and incorporated, Labs reviewed, notes from prior ED visits and [place] Controlled Substance Database   ____________________________________________   FINAL CLINICAL IMPRESSION(S) / ED DIAGNOSES  Final diagnoses:  Dizziness      NEW MEDICATIONS STARTED DURING THIS VISIT:  New Prescriptions   No medications on file     Note:  This document was prepared using Dragon voice recognition software and may include unintentional dictation errors.    Merlyn Lot, MD 09/07/19 [id number]

## 2019-09-07 NOTE — ED Triage Notes (Signed)
Pt arrived via EMS from home where she reported HA last night at 1700, woke this AM at 0300 with dizziness and nausea. Pt sts, "I feel like the room is spinning." No emesis at this time.

## 2020-05-27 ENCOUNTER — Other Ambulatory Visit: Payer: Self-pay | Admitting: General Surgery

## 2020-05-27 NOTE — Progress Notes (Signed)
Subjective:     Patient ID: Dana Murillo is a 60 y.o. female.  HPI  The following portions of the patient's history were reviewed and updated as appropriate.  This an established patient is here today for: office visit. The patient has been referred by Dr. Hall Murillo for evaluation of a colonoscopy. The patient reports she has never had a colonoscopy in the past.   I last saw the patient in July 2017 in regards to microcalcifications on screening mammography.      Chief Complaint  Patient presents with  . Colonoscopy    discussion     BP 152/86   Pulse 73   Temp 36.3 C (97.4 F)   Ht 154.9 cm (5\' 1" )   Wt (!) 126.6 kg (279 lb)   LMP 02/02/1992   SpO2 96%   BMI 52.72 kg/m       Past Medical History:  Diagnosis Date  . Arthritis   . Epilepsia (CMS-[facility])   . Hypercholesterolemia   . Hypertension   . Seasonal allergies           Past Surgical History:  Procedure Laterality Date  . ABDOMINAL HYSTERECTOMY    . breast surgery Right 2009  . INCISIONAL BIOPSY BREAST Right 10/2012  . REPLACEMENT TOTAL KNEE BILATERAL  2013, 2014   Dr. Earnestine Murillo              OB History    Gravida  0   Para  0   Term  0   Preterm  0   AB  0   Living  0     SAB  0   TAB  0   Ectopic  0   Molar  0   Multiple  0   Live Births  0       Obstetric Comments  Age at first period 83         Social History          Socioeconomic History  . Marital status: Unknown    Spouse name: Not on file  . Number of children: Not on file  . Years of education: Not on file  . Highest education level: Not on file  Occupational History  . Not on file  Tobacco Use  . Smoking status: Never Smoker  . Smokeless tobacco: Never Used  Substance and Sexual Activity  . Alcohol use: Not Currently  . Drug use: Never  . Sexual activity: Defer  Other Topics Concern  . Not on file  Social History Narrative  . Not on file   Social Determinants of  Health      Financial Resource Strain:   . Difficulty of Paying Living Expenses:   Food Insecurity:   . Worried About Charity fundraiser in the Last Year:   . Arboriculturist in the Last Year:   Transportation Needs:   . Film/video editor (Medical):   Marland Kitchen Lack of Transportation (Non-Medical):        No Known Allergies  Current Medications        Current Outpatient Medications  Medication Sig Dispense Refill  . albuterol 90 mcg/actuation inhaler Inhale 2 inhalations into the lungs every 6 (six) hours as needed for Wheezing    . BREO ELLIPTA 100-25 mcg/dose DsDv inhaler     . carvediloL (COREG) 25 MG tablet     . diclofenac (VOLTAREN) 50 MG EC tablet     . divalproex (DEPAKOTE) 250 MG DR  tablet Take by mouth    . docosahexaenoic acid-epa 120-180 mg Cap Take by mouth    . fluconazole (DIFLUCAN) 100 MG tablet Take by mouth    . glucosamine su 2KCl-chondroit 500-400 mg Tab Take by mouth    . meclizine (ANTIVERT) 25 mg tablet TAKE 1 TABLET (25 MG TOTAL) BY MOUTH EVERY 8 (EIGHT) HOURS AS NEEDED FOR DIZZINESS.    . multivitamin with minerals tablet Take by mouth    . simvastatin (ZOCOR) 40 MG tablet Take by mouth    . bisacodyL (DULCOLAX) 5 mg EC tablet Take two tablets morning and two tablets afternoon day prior to Miralax prep. 4 tablet 0  . polyethylene glycol (MIRALAX) powder One bottle for colonoscopy prep. Use as directed. 255 g 0   No current facility-administered medications for this visit.           Family History  Problem Relation Age of Onset  . Diabetes Mother   . Heart disease Father   . Diabetes Father   . Breast cancer Neg Hx   . Colon cancer Neg Hx   . Colon polyps Neg Hx       Review of Systems  Constitutional: Negative for chills and fever.  Respiratory: Negative for cough.   Gastrointestinal: Negative for blood in stool, constipation and diarrhea.       Objective:   Physical Exam Exam conducted with  a chaperone present.  Constitutional:      Appearance: Normal appearance.  Cardiovascular:     Rate and Rhythm: Normal rate and regular rhythm.     Pulses: Normal pulses.     Heart sounds: Normal heart sounds.  Pulmonary:     Effort: Pulmonary effort is normal.     Breath sounds: Normal breath sounds.  Musculoskeletal:     Cervical back: Neck supple.  Skin:    General: Skin is warm and dry.  Neurological:     Mental Status: She is alert and oriented to person, place, and time.  Psychiatric:        Mood and Affect: Mood normal.        Behavior: Behavior normal.    Labs and Radiology:   September 07, 2019 laboratory studies: Sodium 135 - 145 mmol/L 138   Potassium 3.5 - 5.1 mmol/L 3.7   Chloride 98 - 111 mmol/L 101   CO2 22 - 32 mmol/L 26   Glucose, Bld 70 - 99 mg/dL 156High   Comment: Glucose reference range applies only to samples taken after fasting for at least 8 hours.  BUN 8 - 23 mg/dL 20   Creatinine, Ser 0.44 - 1.00 mg/dL 0.71   Calcium 8.9 - 10.3 mg/dL 8.7Low   GFR calc non Af Amer >60 mL/min >60   GFR calc Af Amer >60 mL/min >60   Anion gap 5 - 15 11    WBC 4.0 - 10.5 K/uL 9.0   RBC 3.87 - 5.11 MIL/uL 4.87   Hemoglobin 12.0 - 15.0 g/dL 14.2   HCT 36 - 46 % 41.1   MCV 80.0 - 100.0 fL 84.4   MCH 26.0 - 34.0 pg 29.2   MCHC 30.0 - 36.0 g/dL 34.5   RDW 11.5 - 15.5 % 16.6High   Platelets 150 - 400 K/uL 190   nRBC 0.0 - 0.2 % 0.0        Assessment:     Candidate for colon screening    Plan:     Colon screen options were reviewed including  1) Cologuard and 2) colonoscopy.  Pros and cons of each and risks associated with the latter were reviewed.    Entered by Ledell Noss, CMA, acting as a scribe for Dr. Hervey Ard, MD.   The documentation recorded by the scribe accurately reflects the service I personally performed and the decisions made by me.   Dana Bellow, MD FACS   Since October 2021 exam the patient has been identified  with a HGB of 10.1.  EGD planned at the time of colonoscopy.

## 2020-06-05 ENCOUNTER — Encounter: Payer: Self-pay | Admitting: General Surgery

## 2020-06-06 ENCOUNTER — Ambulatory Visit: Payer: BC Managed Care – PPO | Admitting: Certified Registered"

## 2020-06-06 ENCOUNTER — Encounter
Admission: RE | Disposition: A | Discharge status: Home / Self Care | Payer: Self-pay | Source: Home / Self Care | Attending: General Surgery

## 2020-06-06 ENCOUNTER — Other Ambulatory Visit: Payer: Self-pay

## 2020-06-06 ENCOUNTER — Ambulatory Visit
Admission: RE | Admit: 2020-06-06 | Discharge: 2020-06-06 | Disposition: A | Discharge status: Home / Self Care | Payer: BC Managed Care – PPO | Attending: General Surgery | Admitting: General Surgery

## 2020-06-06 ENCOUNTER — Encounter: Payer: Self-pay | Admitting: General Surgery

## 2020-06-06 DIAGNOSIS — K5730 Diverticulosis of large intestine without perforation or abscess without bleeding: Secondary | ICD-10-CM | POA: Insufficient documentation

## 2020-06-06 DIAGNOSIS — Z1211 Encounter for screening for malignant neoplasm of colon: Secondary | ICD-10-CM | POA: Diagnosis present

## 2020-06-06 DIAGNOSIS — D127 Benign neoplasm of rectosigmoid junction: Secondary | ICD-10-CM | POA: Insufficient documentation

## 2020-06-06 DIAGNOSIS — K317 Polyp of stomach and duodenum: Secondary | ICD-10-CM | POA: Diagnosis not present

## 2020-06-06 DIAGNOSIS — Z79899 Other long term (current) drug therapy: Secondary | ICD-10-CM | POA: Insufficient documentation

## 2020-06-06 DIAGNOSIS — Z7951 Long term (current) use of inhaled steroids: Secondary | ICD-10-CM | POA: Diagnosis not present

## 2020-06-06 DIAGNOSIS — D509 Iron deficiency anemia, unspecified: Secondary | ICD-10-CM | POA: Insufficient documentation

## 2020-06-06 DIAGNOSIS — Z96653 Presence of artificial knee joint, bilateral: Secondary | ICD-10-CM | POA: Diagnosis not present

## 2020-06-06 DIAGNOSIS — K259 Gastric ulcer, unspecified as acute or chronic, without hemorrhage or perforation: Secondary | ICD-10-CM | POA: Diagnosis not present

## 2020-06-06 HISTORY — PX: COLONOSCOPY WITH PROPOFOL: SHX5780

## 2020-06-06 HISTORY — PX: ESOPHAGOGASTRODUODENOSCOPY (EGD) WITH PROPOFOL: SHX5813

## 2020-06-06 HISTORY — DX: Unspecified asthma, uncomplicated: J45.909

## 2020-06-06 SURGERY — ESOPHAGOGASTRODUODENOSCOPY (EGD) WITH PROPOFOL
Anesthesia: General

## 2020-06-06 MED ORDER — LIDOCAINE HCL (CARDIAC) PF 100 MG/5ML IV SOSY
PREFILLED_SYRINGE | INTRAVENOUS | Status: DC | PRN
  Administered 2020-06-06: 100 mg via INTRAVENOUS

## 2020-06-06 MED ORDER — GLYCOPYRROLATE 0.2 MG/ML IJ SOLN
INTRAMUSCULAR | Status: DC | PRN
  Administered 2020-06-06: .2 mg via INTRAVENOUS

## 2020-06-06 MED ORDER — PANTOPRAZOLE SODIUM 40 MG PO TBEC
40.0000 mg | DELAYED_RELEASE_TABLET | Freq: Every day | ORAL | 1 refills | Status: DC
Start: 2020-06-06 — End: 2023-08-02

## 2020-06-06 MED ORDER — PROPOFOL 10 MG/ML IV BOLUS
INTRAVENOUS | Status: AC
  Filled 2020-06-06: qty 20

## 2020-06-06 MED ORDER — MIDAZOLAM HCL 2 MG/2ML IJ SOLN
INTRAMUSCULAR | Status: DC | PRN
  Administered 2020-06-06: 2 mg via INTRAVENOUS

## 2020-06-06 MED ORDER — PROPOFOL 500 MG/50ML IV EMUL
INTRAVENOUS | Status: DC | PRN
  Administered 2020-06-06: 145 ug/kg/min via INTRAVENOUS

## 2020-06-06 MED ORDER — SODIUM CHLORIDE 0.9 % IV SOLN: INTRAVENOUS | Status: DC

## 2020-06-06 MED ORDER — MIDAZOLAM HCL 2 MG/2ML IJ SOLN
INTRAMUSCULAR | Status: AC
  Filled 2020-06-06: qty 2

## 2020-06-06 MED ORDER — PROPOFOL 10 MG/ML IV BOLUS
INTRAVENOUS | Status: DC | PRN
  Administered 2020-06-06: 60 mg via INTRAVENOUS
  Administered 2020-06-06: 20 mg via INTRAVENOUS

## 2020-06-06 NOTE — Anesthesia Procedure Notes (Signed)
Procedure Name: General with mask airway Performed by: Fletcher-Harrison, Aylla Huffine, CRNA Pre-anesthesia Checklist: Patient identified, Emergency Drugs available, Suction available and Patient being monitored Patient Re-evaluated:Patient Re-evaluated prior to induction Oxygen Delivery Method: Simple face mask Induction Type: IV induction Placement Confirmation: positive ETCO2 and CO2 detector Dental Injury: Teeth and Oropharynx as per pre-operative assessment        

## 2020-06-06 NOTE — Transfer of Care (Signed)
Immediate Anesthesia Transfer of Care Note  Patient: Dana Murillo  Procedure(s) Performed: ESOPHAGOGASTRODUODENOSCOPY (EGD) WITH PROPOFOL (N/A ) COLONOSCOPY WITH PROPOFOL (N/A )  Patient Location: Endoscopy Unit  Anesthesia Type:General  Level of Consciousness: awake, drowsy and patient cooperative  Airway & Oxygen Therapy: Patient Spontanous Breathing and Patient connected to face mask oxygen  Post-op Assessment: Report given to RN and Post -op Vital signs reviewed and stable  Post vital signs: Reviewed and stable  Last Vitals:  Vitals Value Taken Time  BP 152/92 06/06/20 1024  Temp    Pulse 82 06/06/20 1027  Resp 16 06/06/20 1027  SpO2 99 % 06/06/20 1027  Vitals shown include unvalidated device data.  Last Pain:  Vitals:   06/06/20 1020  TempSrc: Temporal  PainSc:          Complications: No complications documented.

## 2020-06-06 NOTE — Anesthesia Preprocedure Evaluation (Signed)
Anesthesia Evaluation  Patient identified by MRN, date of birth, ID band Patient awake    Reviewed: Allergy & Precautions, H&P , NPO status , Patient's Chart, lab work & pertinent test results, reviewed documented beta blocker date and time   Airway Mallampati: II   Neck ROM: full    Dental  (+) Poor Dentition, Teeth Intact   Pulmonary asthma ,    Pulmonary exam normal        Cardiovascular Exercise Tolerance: Good hypertension, On Medications negative cardio ROS Normal cardiovascular exam Rhythm:regular Rate:Normal     Neuro/Psych Seizures -,  negative psych ROS   GI/Hepatic negative GI ROS, Neg liver ROS,   Endo/Other  Morbid obesity  Renal/GU negative Renal ROS  negative genitourinary   Musculoskeletal   Abdominal   Peds  Hematology negative hematology ROS (+)   Anesthesia Other Findings Past Medical History: No date: Arthritis No date: Asthma No date: Epilepsia [facility]) No date: High cholesterol No date: Hypertension Past Surgical History: No date: ABDOMINAL HYSTERECTOMY 10/2012: BREAST BIOPSY; Right     Comment:  BENIGN BREAST TISSUE  FOCAL COMPLEX SCLEROSING LESION 2009: BREAST SURGERY; Right 8916,9450: REPLACEMENT TOTAL KNEE; Bilateral     Comment:  Dr Jonny Ruiz BMI    Body Mass Index: 49.13 kg/m     Reproductive/Obstetrics negative OB ROS                             Anesthesia Physical Anesthesia Plan  ASA: III  Anesthesia Plan: General   Post-op Pain Management:    Induction:   PONV Risk Score and Plan:   Airway Management Planned:   Additional Equipment:   Intra-op Plan:   Post-operative Plan:   Informed Consent: I have reviewed the patients History and Physical, chart, labs and discussed the procedure including the risks, benefits and alternatives for the proposed anesthesia with the patient or authorized representative who has indicated his/her  understanding and acceptance.     Dental Advisory Given  Plan Discussed with: CRNA  Anesthesia Plan Comments:         Anesthesia Quick Evaluation

## 2020-06-06 NOTE — H&P (Addendum)
Dana Murillo [id number] 1960-06-30     HPI:  60 y/o woman for screening colonoscopy. Tolerated the prep well. Since her original evaluation, she has been identified with a HGB of 10.1. For EGD to assess for possible bleeding site.   Medications Prior to Admission  Medication Sig Dispense Refill Last Dose  . albuterol (VENTOLIN HFA) 108 (90 Base) MCG/ACT inhaler Inhale into the lungs every 6 (six) hours as needed for wheezing or shortness of breath.   Past Week at Unknown time  . BREO ELLIPTA 100-25 MCG/INH AEPB Inhale 1 puff into the lungs daily.   Past Week at Unknown time  . Calcium Carbonate-Vit D-Min (CALCIUM 1200 PO) Take 1 tablet by mouth daily.    Past Week at Unknown time  . carvedilol (COREG) 25 MG tablet Take 25 mg by mouth 2 (two) times daily with a meal.    06/05/2020 at 0800  . diclofenac (VOLTAREN) 50 MG EC tablet Take 50 mg by mouth 2 (two) times daily.    06/05/2020 at 0800  . divalproex (DEPAKOTE) 250 MG DR tablet Take 250-500 mg by mouth 3 (three) times daily. Take 250 at breakfast , 500 at lunch, and 500 in the evening   06/06/2020 at 0600  . DOCOSAHEXAENOIC ACID-EPA PO Take 120-180 mg by mouth.   Past Week at Unknown time  . fish oil-omega-3 fatty acids 1000 MG capsule Take 4 g by mouth daily.    Past Week at Unknown time  . fluconazole (DIFLUCAN) 100 MG tablet Take 1 tablet (100 mg total) by mouth daily. Take 1 tablet every 3 days for two doses 2 tablet 0 Past Month at Unknown time  . glucosamine-chondroitin 500-400 MG tablet Take 1 tablet by mouth daily.    06/05/2020 at 0800  . meclizine (ANTIVERT) 25 MG tablet Take 1 tablet (25 mg total) by mouth every 8 (eight) hours as needed for dizziness. 10 tablet 0 Past Month at Unknown time  . Multiple Vitamins-Minerals (MULTIVITAMIN WITH MINERALS) tablet Take 1 tablet by mouth daily.   Past Week at Unknown time  . nystatin (MYCOSTATIN/NYSTOP) powder Apply topically 4 (four) times daily. 15 g 0 Past Month at 0800  . simvastatin (ZOCOR)  40 MG tablet Take 40 mg by mouth every evening.   06/05/2020 at Unknown time   No Known Allergies Past Medical History:  Diagnosis Date  . Arthritis   . Asthma   . Epilepsia ([place])   . High cholesterol   . Hypertension    Past Surgical History:  Procedure Laterality Date  . ABDOMINAL HYSTERECTOMY    . BREAST BIOPSY Right 10/2012   BENIGN BREAST TISSUE  FOCAL COMPLEX SCLEROSING LESION  . BREAST SURGERY Right 2009  . REPLACEMENT TOTAL KNEE Bilateral 2013,2014   Dr Jonny Ruiz   Social History   Socioeconomic History  . Marital status: Married    Spouse name: Not on file  . Number of children: Not on file  . Years of education: Not on file  . Highest education level: Not on file  Occupational History  . Not on file  Tobacco Use  . Smoking status: Never Smoker  . Smokeless tobacco: Never Used  Substance and Sexual Activity  . Alcohol use: No  . Drug use: No  . Sexual activity: Not on file  Other Topics Concern  . Not on file  Social History Narrative  . Not on file   Social Determinants of Health   Financial Resource Strain: Not on file  Food Insecurity: Not on file  Transportation Needs: Not on file  Physical Activity: Not on file  Stress: Not on file  Social Connections: Not on file  Intimate Partner Violence: Not on file   Social History   Social History Narrative  . Not on file     ROS: Negative.     PE: HEENT: Negative. Lungs: Clear. Cardio: RR.  Assessment/Plan:  Proceed with planned upper and lower endoscopy.  [name] [facility] 06/06/2020

## 2020-06-06 NOTE — Op Note (Signed)
[facility] Gastroenterology Patient Name: Dana Murillo Procedure Date: 06/06/2020 9:18 AM MRN: [id number] Account #: [id number] Date of Birth: Oct 03, 1960 Admit Type: Outpatient Age: 61 Room: [facility] ENDO ROOM 1 Gender: Female Note Status: Finalized Procedure:             Colonoscopy Indications:           Screening for colorectal malignant neoplasm Providers:             Robert Bellow, MD Referring MD:          Leona Carry. Hall Busing, MD (Referring MD) Medicines:             Propofol per Anesthesia Complications:         No immediate complications. Procedure:             Pre-Anesthesia Assessment:                        - Prior to the procedure, a History and Physical was                         performed, and patient medications, allergies and                         sensitivities were reviewed. The patient's tolerance                         of previous anesthesia was reviewed.                        - The risks and benefits of the procedure and the                         sedation options and risks were discussed with the                         patient. All questions were answered and informed                         consent was obtained.                        After obtaining informed consent, the colonoscope was                         passed under direct vision. Throughout the procedure,                         the patient's blood pressure, pulse, and oxygen                         saturations were monitored continuously. The was                         introduced through the anus and advanced to the the                         cecum, identified by appendiceal orifice and ileocecal  valve. The colonoscopy was somewhat difficult due to a                         tortuous colon. Successful completion of the procedure                         was aided by using manual pressure. The patient                         tolerated the procedure well.  The quality of the bowel                         preparation was excellent. Findings:      A few medium-mouthed diverticula were found in the sigmoid colon. There       was no evidence of diverticular bleeding.      A 6 mm polyp was found in the recto-sigmoid colon. The polyp was       sessile. The polyp was removed with a cold snare. Resection and       retrieval were complete.      The retroflexed view of the distal rectum and anal verge was normal and       showed no anal or rectal abnormalities. Impression:            - Mild diverticulosis in the sigmoid colon. There was                         no evidence of diverticular bleeding.                        - One 6 mm polyp at the recto-sigmoid colon, removed                         with a cold snare. Resected and retrieved.                        - The distal rectum and anal verge are normal on                         retroflexion view. Recommendation:        - Telephone endoscopist for pathology results in 1                         week. Procedure Code(s):     --- Professional ---                        [phone], Colonoscopy, flexible; with removal of                         tumor(s), polyp(s), or other lesion(s) by snare                         technique Diagnosis Code(s):     --- Professional ---                        Z12.11, Encounter for screening for malignant neoplasm  of colon                        K63.5, Polyp of colon                        K57.30, Diverticulosis of large intestine without                         perforation or abscess without bleeding CPT copyright 2019 American Medical Association. All rights reserved. The codes documented in this report are preliminary and upon coder review may  be revised to meet current compliance requirements. Robert Bellow, MD 06/06/2020 10:19:32 AM This report has been signed electronically. Number of Addenda: 0 Note Initiated On: 06/06/2020 9:18 AM Scope  Withdrawal Time: 0 hours 24 minutes 2 seconds  Total Procedure Duration: 0 hours 34 minutes 29 seconds  Estimated Blood Loss:  Estimated blood loss: none.      [facility]

## 2020-06-06 NOTE — Op Note (Signed)
[facility] Gastroenterology Patient Name: Dana Murillo Procedure Date: 06/06/2020 9:18 AM MRN: [id number] Account #: [id number] Date of Birth: 1960/12/02 Admit Type: Outpatient Age: 60 Room: [facility] ENDO ROOM 1 Gender: Female Note Status: Finalized Procedure:             Upper GI endoscopy Indications:           Iron deficiency anemia Providers:             Robert Bellow, MD Referring MD:          Leona Carry. Hall Busing, MD (Referring MD) Medicines:             Propofol per Anesthesia Complications:         No immediate complications. Procedure:             Pre-Anesthesia Assessment:                        - Prior to the procedure, a History and Physical was                         performed, and patient medications, allergies and                         sensitivities were reviewed. The patient's tolerance                         of previous anesthesia was reviewed.                        - The risks and benefits of the procedure and the                         sedation options and risks were discussed with the                         patient. All questions were answered and informed                         consent was obtained.                        After obtaining informed consent, the endoscope was                         passed under direct vision. Throughout the procedure,                         the patient's blood pressure, pulse, and oxygen                         saturations were monitored continuously. The Endoscope                         was introduced through the mouth, and advanced to the                         third part of duodenum. The upper GI endoscopy was  accomplished without difficulty. The patient tolerated                         the procedure well. Findings:      The esophagus was normal.      The examined duodenum was normal.      One non-bleeding cratered gastric ulcer with no stigmata of bleeding was       found in  the prepyloric region of the stomach. The lesion was 5 mm in       largest dimension. Biopsies were taken with a cold forceps for histology.      Multiple 5 mm sessile polyps with no bleeding and no stigmata of recent       bleeding were found in the gastric fundus and in the gastric body. Impression:            - Normal esophagus.                        - Normal examined duodenum.                        - Non-bleeding gastric ulcer with no stigmata of                         bleeding. Biopsied.                        - Multiple gastric polyps. Recommendation:        - Telephone endoscopist for pathology results in 1                         week. Procedure Code(s):     --- Professional ---                        [phone], Esophagogastroduodenoscopy, flexible,                         transoral; with biopsy, single or multiple Diagnosis Code(s):     --- Professional ---                        K25.9, Gastric ulcer, unspecified as acute or chronic,                         without hemorrhage or perforation                        K31.7, Polyp of stomach and duodenum                        D50.9, Iron deficiency anemia, unspecified CPT copyright 2019 American Medical Association. All rights reserved. The codes documented in this report are preliminary and upon coder review may  be revised to meet current compliance requirements. Robert Bellow, MD 06/06/2020 9:40:29 AM This report has been signed electronically. Number of Addenda: 0 Note Initiated On: 06/06/2020 9:18 AM Estimated Blood Loss:  Estimated blood loss: none.      [facility]

## 2020-06-09 ENCOUNTER — Encounter: Payer: Self-pay | Admitting: General Surgery

## 2020-06-10 LAB — SURGICAL PATHOLOGY

## 2020-06-10 NOTE — Anesthesia Postprocedure Evaluation (Signed)
Anesthesia Post Note  Patient: Dana Murillo  Procedure(s) Performed: ESOPHAGOGASTRODUODENOSCOPY (EGD) WITH PROPOFOL (N/A ) COLONOSCOPY WITH PROPOFOL (N/A )  Patient location during evaluation: PACU Anesthesia Type: General Level of consciousness: awake and alert Pain management: pain level controlled Vital Signs Assessment: post-procedure vital signs reviewed and stable Respiratory status: spontaneous breathing, nonlabored ventilation, respiratory function stable and patient connected to nasal cannula oxygen Cardiovascular status: blood pressure returned to baseline and stable Postop Assessment: no apparent nausea or vomiting Anesthetic complications: no   No complications documented.   Last Vitals:  Vitals:   06/06/20 1030 06/06/20 1040  BP: (!) 158/74 (!) 146/83  Pulse: 80 79  Resp: 16 18  Temp:    SpO2: 100% 92%    Last Pain:  Vitals:   06/08/20 0946  TempSrc:   PainSc: 0-No pain                 Molli Barrows

## 2020-06-13 ENCOUNTER — Other Ambulatory Visit: Payer: Self-pay | Admitting: General Surgery

## 2020-06-13 NOTE — Progress Notes (Signed)
Dana Murillo [id number] 07-07-1960     HPI: Patient identified with new onset anemia. EGD on Jun 06, 2020 showed a gastric ulcer.  Off NSAIA and on Protonix since that time.  For f/u endoscopy.   (Not in a hospital admission)  No Known Allergies Past Medical History:  Diagnosis Date  . Arthritis   . Asthma   . Epilepsia ([place])   . High cholesterol   . Hypertension    Past Surgical History:  Procedure Laterality Date  . ABDOMINAL HYSTERECTOMY    . BREAST BIOPSY Right 10/2012   BENIGN BREAST TISSUE  FOCAL COMPLEX SCLEROSING LESION  . BREAST SURGERY Right 2009  . COLONOSCOPY WITH PROPOFOL N/A 06/06/2020   Procedure: COLONOSCOPY WITH PROPOFOL;  Surgeon: Robert Bellow, MD;  Location: [facility] ENDOSCOPY;  Service: Endoscopy;  Laterality: N/A;  . ESOPHAGOGASTRODUODENOSCOPY (EGD) WITH PROPOFOL N/A 06/06/2020   Procedure: ESOPHAGOGASTRODUODENOSCOPY (EGD) WITH PROPOFOL;  Surgeon: Robert Bellow, MD;  Location: [facility] ENDOSCOPY;  Service: Endoscopy;  Laterality: N/A;  . REPLACEMENT TOTAL KNEE Bilateral 2013,2014   Dr Jonny Ruiz   Social History   Socioeconomic History  . Marital status: Married    Spouse name: Not on file  . Number of children: Not on file  . Years of education: Not on file  . Highest education level: Not on file  Occupational History  . Not on file  Tobacco Use  . Smoking status: Never Smoker  . Smokeless tobacco: Never Used  Substance and Sexual Activity  . Alcohol use: No  . Drug use: No  . Sexual activity: Not on file  Other Topics Concern  . Not on file  Social History Narrative  . Not on file   Social Determinants of Health   Financial Resource Strain: Not on file  Food Insecurity: Not on file  Transportation Needs: Not on file  Physical Activity: Not on file  Stress: Not on file  Social Connections: Not on file  Intimate Partner Violence: Not on file   Social History   Social History Narrative  . Not on file     ROS: Negative.      PE: HEENT: Negative. Lungs: Clear. Cardio: RR.   Assessment/Plan:  Proceed with planned endoscopy.   [name] [facility] 06/13/2020

## 2020-08-07 ENCOUNTER — Encounter: Payer: Self-pay | Admitting: General Surgery

## 2020-08-08 ENCOUNTER — Ambulatory Visit
Admission: RE | Admit: 2020-08-08 | Discharge: 2020-08-08 | Disposition: A | Discharge status: Home / Self Care | Payer: BC Managed Care – PPO | Attending: General Surgery | Admitting: General Surgery

## 2020-08-08 ENCOUNTER — Other Ambulatory Visit: Payer: Self-pay

## 2020-08-08 ENCOUNTER — Ambulatory Visit: Payer: BC Managed Care – PPO | Admitting: Anesthesiology

## 2020-08-08 ENCOUNTER — Encounter: Payer: Self-pay | Admitting: General Surgery

## 2020-08-08 ENCOUNTER — Encounter
Admission: RE | Disposition: A | Discharge status: Home / Self Care | Payer: Self-pay | Source: Home / Self Care | Attending: General Surgery

## 2020-08-08 DIAGNOSIS — Z09 Encounter for follow-up examination after completed treatment for conditions other than malignant neoplasm: Secondary | ICD-10-CM | POA: Insufficient documentation

## 2020-08-08 DIAGNOSIS — Z8711 Personal history of peptic ulcer disease: Secondary | ICD-10-CM | POA: Diagnosis not present

## 2020-08-08 DIAGNOSIS — Z79899 Other long term (current) drug therapy: Secondary | ICD-10-CM | POA: Diagnosis not present

## 2020-08-08 DIAGNOSIS — Z7951 Long term (current) use of inhaled steroids: Secondary | ICD-10-CM | POA: Insufficient documentation

## 2020-08-08 DIAGNOSIS — K449 Diaphragmatic hernia without obstruction or gangrene: Secondary | ICD-10-CM | POA: Diagnosis not present

## 2020-08-08 HISTORY — PX: ESOPHAGOGASTRODUODENOSCOPY (EGD) WITH PROPOFOL: SHX5813

## 2020-08-08 HISTORY — DX: Gastric ulcer, unspecified as acute or chronic, without hemorrhage or perforation: K25.9

## 2020-08-08 SURGERY — ESOPHAGOGASTRODUODENOSCOPY (EGD) WITH PROPOFOL
Anesthesia: General

## 2020-08-08 MED ORDER — PROPOFOL 10 MG/ML IV BOLUS
INTRAVENOUS | Status: DC | PRN
  Administered 2020-08-08 (×2): 30 mg via INTRAVENOUS
  Administered 2020-08-08: 70 mg via INTRAVENOUS

## 2020-08-08 MED ORDER — HYDRALAZINE HCL 20 MG/ML IJ SOLN
INTRAMUSCULAR | Status: DC | PRN
  Administered 2020-08-08: 10 mg via INTRAVENOUS

## 2020-08-08 NOTE — Transfer of Care (Signed)
Immediate Anesthesia Transfer of Care Note  Patient: Dana Murillo  Procedure(s) Performed: ESOPHAGOGASTRODUODENOSCOPY (EGD) WITH PROPOFOL  Patient Location: PACU  Anesthesia Type:General  Level of Consciousness: awake, alert  and oriented  Airway & Oxygen Therapy: Patient Spontanous Breathing  Post-op Assessment: Report given to RN and Post -op Vital signs reviewed and stable  Post vital signs: Reviewed and stable  Last Vitals:  Vitals Value Taken Time  BP 165/73 08/08/20 1224  Temp 36.1 C 08/08/20 1224  Pulse 89 08/08/20 1228  Resp 22 08/08/20 1228  SpO2 97 % 08/08/20 1228  Vitals shown include unvalidated device data.  Last Pain:  Vitals:   08/08/20 1224  TempSrc: Temporal  PainSc: 0-No pain         Complications: No notable events documented.

## 2020-08-08 NOTE — Op Note (Signed)
[facility] Gastroenterology Patient Name: Dana Murillo Procedure Date: 08/08/2020 11:55 AM MRN: [id number] Account #: [id number] Date of Birth: 1960-09-04 Admit Type: Outpatient Age: 60 Room: [facility] ENDO ROOM 1 Gender: Female Note Status: Finalized Procedure:             Upper GI endoscopy Indications:           Follow-up of peptic ulcer Providers:             Robert Bellow, MD Referring MD:          Leona Carry. Hall Busing, MD (Referring MD) Medicines:             Propofol per Anesthesia Complications:         No immediate complications. Procedure:             Pre-Anesthesia Assessment:                        - Prior to the procedure, a History and Physical was                         performed, and patient medications, allergies and                         sensitivities were reviewed. The patient's tolerance                         of previous anesthesia was reviewed.                        - The risks and benefits of the procedure and the                         sedation options and risks were discussed with the                         patient. All questions were answered and informed                         consent was obtained.                        After obtaining informed consent, the endoscope was                         passed under direct vision. Throughout the procedure,                         the patient's blood pressure, pulse, and oxygen                         saturations were monitored continuously. The Endoscope                         was introduced through the mouth, and advanced to the                         second part of duodenum. The upper GI endoscopy was  accomplished without difficulty. The patient tolerated                         the procedure well. Findings:      The esophagus was normal.      A small hiatal hernia was present.      The examined duodenum was normal. Impression:            - Normal esophagus.                         - Small hiatal hernia.                        - Normal examined duodenum.                        - No specimens collected. Recommendation:        - Discharge patient to home (via wheelchair). Procedure Code(s):     --- Professional ---                        [phone], Esophagogastroduodenoscopy, flexible,                         transoral; diagnostic, including collection of                         specimen(s) by brushing or washing, when performed                         (separate procedure) Diagnosis Code(s):     --- Professional ---                        K44.9, Diaphragmatic hernia without obstruction or                         gangrene                        K27.9, Peptic ulcer, site unspecified, unspecified as                         acute or chronic, without hemorrhage or perforation CPT copyright 2019 American Medical Association. All rights reserved. The codes documented in this report are preliminary and upon coder review may  be revised to meet current compliance requirements. Robert Bellow, MD 08/08/2020 12:17:44 PM This report has been signed electronically. Number of Addenda: 0 Note Initiated On: 08/08/2020 11:55 AM Estimated Blood Loss:  Estimated blood loss: none.      [facility]

## 2020-08-08 NOTE — Anesthesia Postprocedure Evaluation (Signed)
Anesthesia Post Note  Patient: Dana Murillo  Procedure(s) Performed: ESOPHAGOGASTRODUODENOSCOPY (EGD) WITH PROPOFOL  Patient location during evaluation: Endoscopy Anesthesia Type: General Level of consciousness: awake and alert Pain management: pain level controlled Vital Signs Assessment: post-procedure vital signs reviewed and stable Respiratory status: spontaneous breathing, nonlabored ventilation, respiratory function stable and patient connected to nasal cannula oxygen Cardiovascular status: blood pressure returned to baseline and stable Postop Assessment: no apparent nausea or vomiting Anesthetic complications: no   No notable events documented.   Last Vitals:  Vitals:   08/08/20 1234 08/08/20 1244  BP: (!) 159/65 (!) 212/90  Pulse: 83 86  Resp: (!) 23 18  Temp:    SpO2: 98% 100%    Last Pain:  Vitals:   08/08/20 1244  TempSrc:   PainSc: 0-No pain                 Precious Haws Annjanette Wertenberger

## 2020-08-08 NOTE — H&P (Signed)
Dana Murillo [id number] 1960-08-19     HPI:  Gastric ulcer identified in May 2022 during evaluation for new anemia.  Today is a follow up exam to confirm healing.   Medications Prior to Admission  Medication Sig Dispense Refill Last Dose   albuterol (VENTOLIN HFA) 108 (90 Base) MCG/ACT inhaler Inhale into the lungs every 6 (six) hours as needed for wheezing or shortness of breath.   08/07/2020   BREO ELLIPTA 100-25 MCG/INH AEPB Inhale 1 puff into the lungs daily.   08/07/2020   Calcium Carbonate-Vit D-Min (CALCIUM 1200 PO) Take 1 tablet by mouth daily.    08/07/2020   carvedilol (COREG) 25 MG tablet Take 25 mg by mouth 2 (two) times daily with a meal.    08/07/2020   divalproex (DEPAKOTE) 250 MG DR tablet Take 250-500 mg by mouth 3 (three) times daily. Take 250 at breakfast , 500 at lunch, and 500 in the evening   08/08/2020   FERROUS GLUCONATE PO Take by mouth in the morning and at bedtime.      fish oil-omega-3 fatty acids 1000 MG capsule Take 4 g by mouth daily.    08/07/2020   furosemide (LASIX) 20 MG tablet Take 20 mg by mouth daily.      glucosamine-chondroitin 500-400 MG tablet Take 1 tablet by mouth daily.    08/07/2020   Multiple Vitamins-Minerals (MULTIVITAMIN WITH MINERALS) tablet Take 1 tablet by mouth daily.   08/07/2020   pantoprazole (PROTONIX) 40 MG tablet Take 1 tablet (40 mg total) by mouth daily. 30 tablet 1 08/07/2020   simvastatin (ZOCOR) 40 MG tablet Take 40 mg by mouth every evening.   08/07/2020   DOCOSAHEXAENOIC ACID-EPA PO Take 120-180 mg by mouth.      fluconazole (DIFLUCAN) 100 MG tablet Take 1 tablet (100 mg total) by mouth daily. Take 1 tablet every 3 days for two doses 2 tablet 0    meclizine (ANTIVERT) 25 MG tablet Take 1 tablet (25 mg total) by mouth every 8 (eight) hours as needed for dizziness. 10 tablet 0    nystatin (MYCOSTATIN/NYSTOP) powder Apply topically 4 (four) times daily. 15 g 0    No Known Allergies Past Medical History:  Diagnosis Date   Arthritis    Asthma     Epilepsia ([place])    Gastric ulcer    High cholesterol    Hypertension    Past Surgical History:  Procedure Laterality Date   ABDOMINAL HYSTERECTOMY     BREAST BIOPSY Right 10/2012   BENIGN BREAST TISSUE  FOCAL COMPLEX SCLEROSING LESION   BREAST SURGERY Right 2009   COLONOSCOPY WITH PROPOFOL N/A 06/06/2020   Procedure: COLONOSCOPY WITH PROPOFOL;  Surgeon: Robert Bellow, MD;  Location: [facility] ENDOSCOPY;  Service: Endoscopy;  Laterality: N/A;   ESOPHAGOGASTRODUODENOSCOPY (EGD) WITH PROPOFOL N/A 06/06/2020   Procedure: ESOPHAGOGASTRODUODENOSCOPY (EGD) WITH PROPOFOL;  Surgeon: Robert Bellow, MD;  Location: [facility] ENDOSCOPY;  Service: Endoscopy;  Laterality: N/A;   REPLACEMENT TOTAL KNEE Bilateral 2013,2014   Dr Jonny Ruiz   Social History   Socioeconomic History   Marital status: Married    Spouse name: Not on file   Number of children: Not on file   Years of education: Not on file   Highest education level: Not on file  Occupational History   Not on file  Tobacco Use   Smoking status: Never   Smokeless tobacco: Never  Vaping Use   Vaping Use: Never used  Substance and Sexual Activity  Alcohol use: No   Drug use: No   Sexual activity: Not on file  Other Topics Concern   Not on file  Social History Narrative   Not on file   Social Determinants of Health   Financial Resource Strain: Not on file  Food Insecurity: Not on file  Transportation Needs: Not on file  Physical Activity: Not on file  Stress: Not on file  Social Connections: Not on file  Intimate Partner Violence: Not on file   Social History   Social History Narrative   Not on file     ROS: Negative.     PE: HEENT: Negative. Lungs: Clear. Cardio: RR.   Assessment/Plan:  Proceed with planned endoscopy.   Dana Murillo [facility] 08/08/2020

## 2020-08-08 NOTE — Anesthesia Preprocedure Evaluation (Signed)
Anesthesia Evaluation  Patient identified by MRN, date of birth, ID band Patient awake    Reviewed: Allergy & Precautions, NPO status , Patient's Chart, lab work & pertinent test results  Airway Mallampati: III  TM Distance: <3 FB Neck ROM: full    Dental  (+) Chipped   Pulmonary neg shortness of breath, asthma , sleep apnea ,    Pulmonary exam normal        Cardiovascular hypertension, (-) anginanegative cardio ROS Normal cardiovascular exam     Neuro/Psych Seizures -,  negative psych ROS   GI/Hepatic Neg liver ROS, PUD, GERD  Medicated and Controlled,  Endo/Other  Morbid obesity  Renal/GU negative Renal ROS  negative genitourinary   Musculoskeletal  (+) Arthritis ,   Abdominal   Peds  Hematology negative hematology ROS (+)   Anesthesia Other Findings Past Medical History: No date: Arthritis No date: Asthma No date: Epilepsia [facility]) No date: Gastric ulcer No date: High cholesterol No date: Hypertension  Past Surgical History: No date: ABDOMINAL HYSTERECTOMY 10/2012: BREAST BIOPSY; Right     Comment:  BENIGN BREAST TISSUE  FOCAL COMPLEX SCLEROSING LESION 2009: BREAST SURGERY; Right 06/06/2020: COLONOSCOPY WITH PROPOFOL; N/A     Comment:  Procedure: COLONOSCOPY WITH PROPOFOL;  Surgeon: Robert Bellow, MD;  Location: [facility] ENDOSCOPY;  Service:               Endoscopy;  Laterality: N/A; 06/06/2020: ESOPHAGOGASTRODUODENOSCOPY (EGD) WITH PROPOFOL; N/A     Comment:  Procedure: ESOPHAGOGASTRODUODENOSCOPY (EGD) WITH               PROPOFOL;  Surgeon: Robert Bellow, MD;  Location:               [facility] ENDOSCOPY;  Service: Endoscopy;  Laterality: N/A; [id number]: REPLACEMENT TOTAL KNEE; Bilateral     Comment:  Dr Jonny Ruiz     Reproductive/Obstetrics negative OB ROS                             Anesthesia Physical Anesthesia Plan  ASA: 3  Anesthesia Plan: General    Post-op Pain Management:    Induction: Intravenous  PONV Risk Score and Plan: Propofol infusion and TIVA  Airway Management Planned: Natural Airway and Nasal Cannula  Additional Equipment:   Intra-op Plan:   Post-operative Plan:   Informed Consent: I have reviewed the patients History and Physical, chart, labs and discussed the procedure including the risks, benefits and alternatives for the proposed anesthesia with the patient or authorized representative who has indicated his/her understanding and acceptance.     Dental Advisory Given  Plan Discussed with: Anesthesiologist, CRNA and Surgeon  Anesthesia Plan Comments: (Patient consented for risks of anesthesia including but not limited to:  - adverse reactions to medications - risk of airway placement if required - damage to eyes, teeth, lips or other oral mucosa - nerve damage due to positioning  - sore throat or hoarseness - Damage to heart, brain, nerves, lungs, other parts of body or loss of life  Patient voiced understanding.)        Anesthesia Quick Evaluation

## 2020-08-11 ENCOUNTER — Encounter: Payer: Self-pay | Admitting: General Surgery

## 2020-11-12 ENCOUNTER — Encounter: Payer: Self-pay | Admitting: General Surgery

## 2021-04-07 LAB — LAB REPORT - SCANNED
A1c: 6.5
EGFR: 102
TSH: 3.63 (ref 0.41–5.90)

## 2021-09-29 LAB — LAB REPORT - SCANNED
A1c: 6.7
EGFR: 97

## 2021-11-11 IMAGING — MR MR HEAD W/O CM
11 series · 46 of 48 positions shown · non-contrast
Comparison: None.

CLINICAL DATA: 59-year-old female with acute headache at 6766 hours
yesterday. Woke with dizziness and nausea at 9399 hours today.

EXAM:
MRI HEAD WITHOUT CONTRAST
TECHNIQUE: Multiplanar, multiecho pulse sequences of the brain and surrounding
structures were obtained without intravenous contrast.

[Series 5: ax dwi_tracew · axial · 3.0mm · 0.60mm/px · z∈[-88,+60]mm · 4 of 48 slices shown]
[im 1/48]
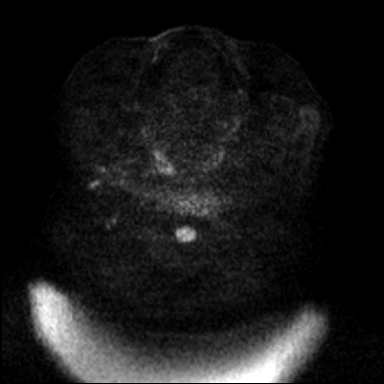
[im 16/48]
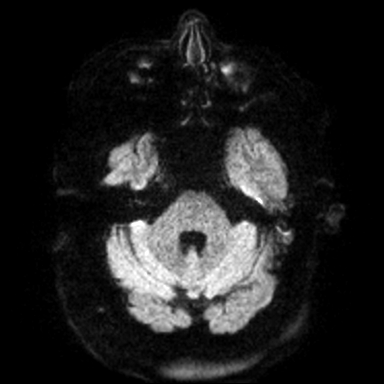
[im 32/48]
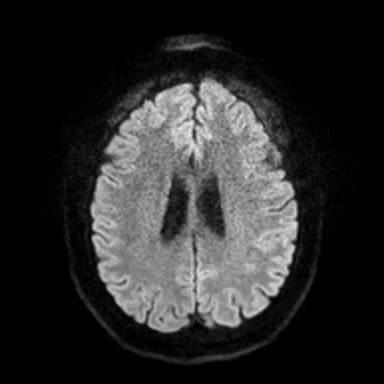
[im 48/48]
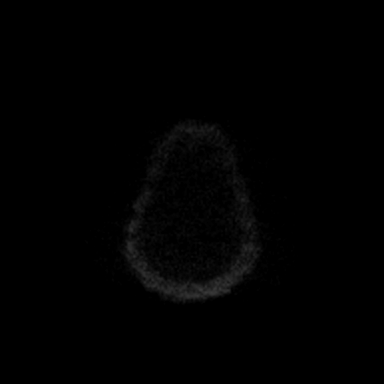

[Series 6: ax dwi_adc · axial · 3.0mm · 0.60mm/px · z∈[-88,+60]mm · 4 of 48 slices shown]
[im 1/48]
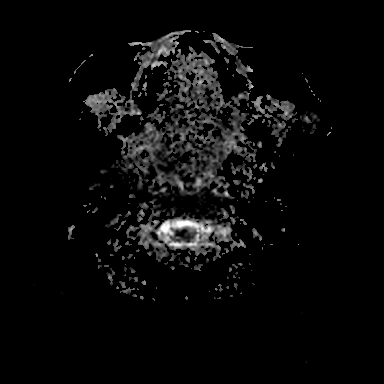
[im 16/48]
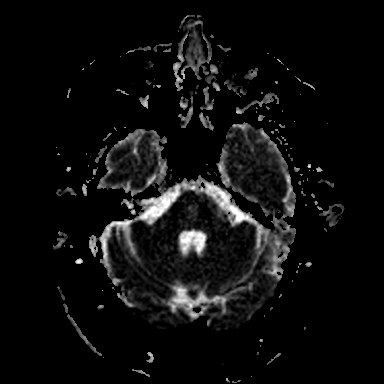
[im 32/48]
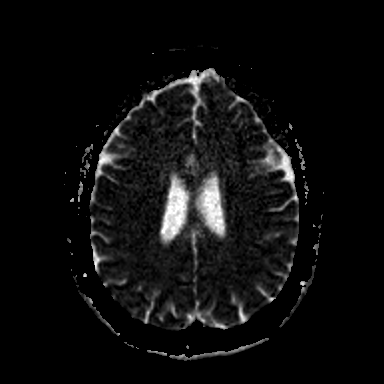
[im 48/48]
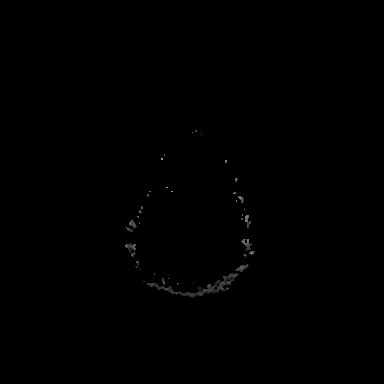

[Series 7: cor dwi_tracew · coronal · 5.0mm · 0.68mm/px · 3 of 40 slices shown]
[im 1/40]
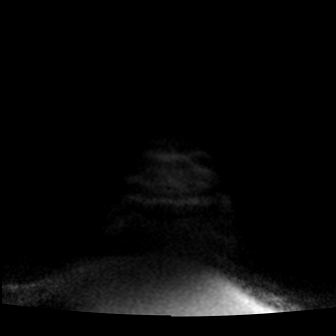
[im 20/40]
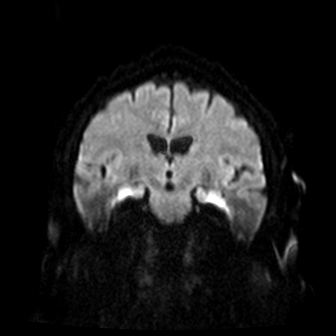
[im 40/40]
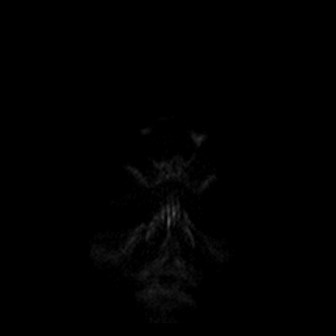

[Series 8: cor dwi_adc · coronal · 5.0mm · 0.68mm/px · 3 of 40 slices shown]
[im 1/40]
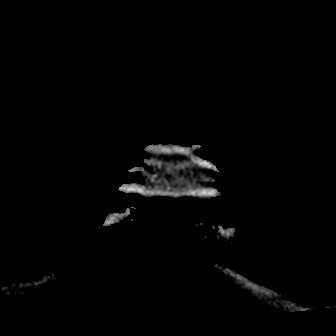
[im 20/40]
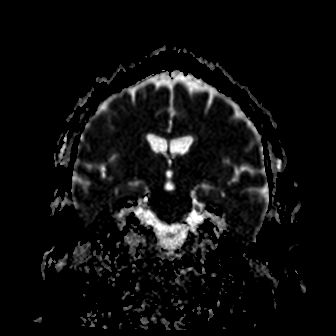
[im 40/40]
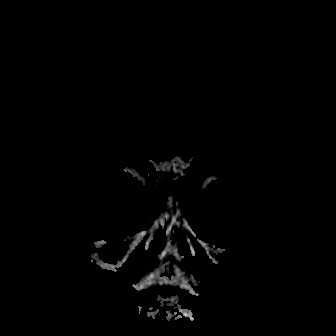

[Series 9: FLAIR · axial · 3.0mm · 0.53mm/px · z∈[-93,+63]mm · 4 of 55 slices shown]
[im 1/55]
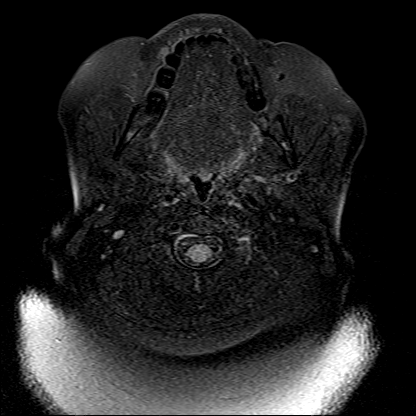
[im 19/55]
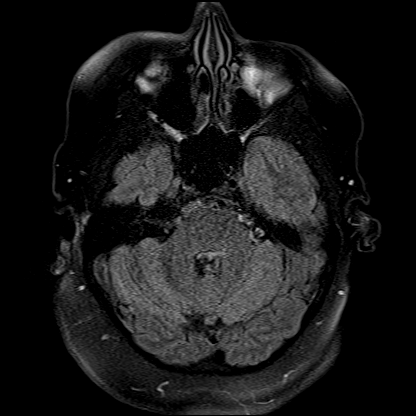
[im 37/55]
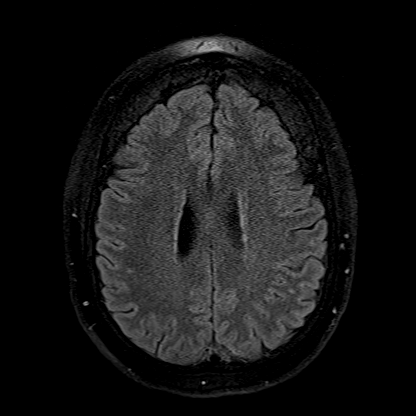
[im 55/55]
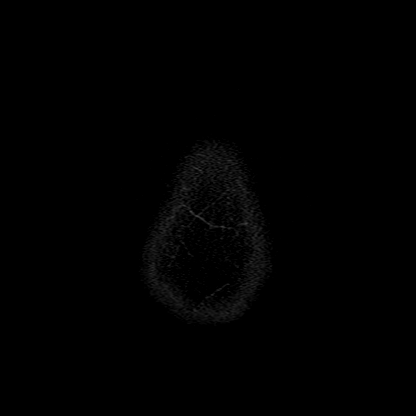

[Series 10: T1 · sagittal · 5.0mm · 0.62mm/px · 2 of 25 slices shown (1 of 2)]
[im 1/25]
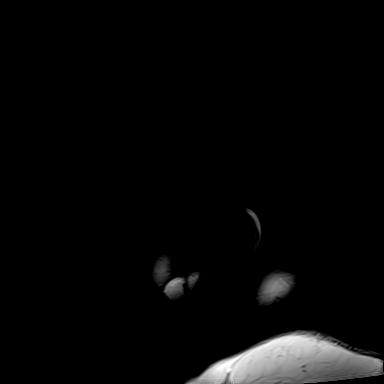
[im 25/25]
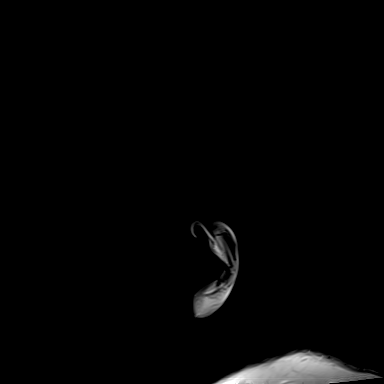

[Series 11: T2 · axial · 5.0mm · 0.53mm/px · z∈[-79,+59]mm · 2 of 25 slices shown (1 of 2)]
[im 1/25]
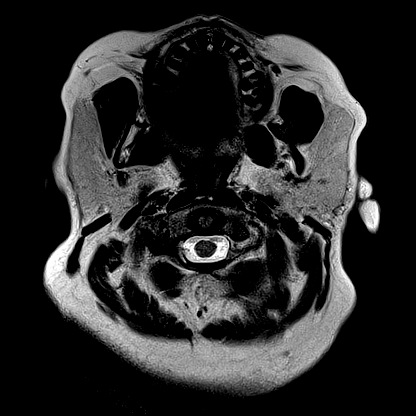
[im 25/25]
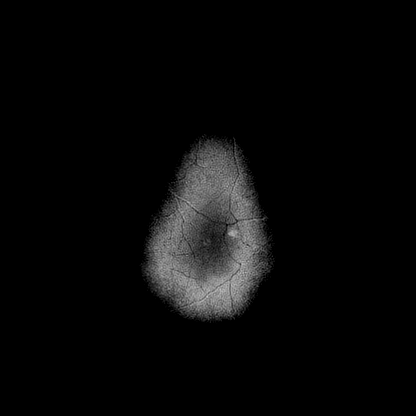

[Series 13: pha_images · axial · 3.0mm · 0.90mm/px · z∈[-93,+76]mm · 5 of 60 slices shown]
[im 1/60]
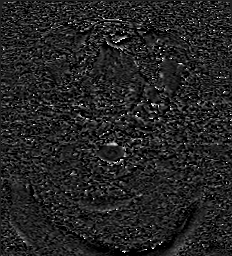
[im 15/60]
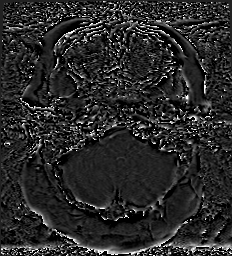
[im 30/60]
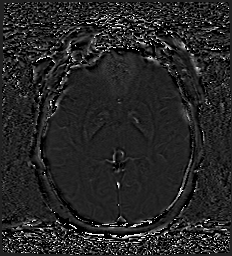
[im 45/60]
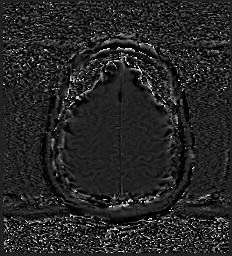
[im 60/60]
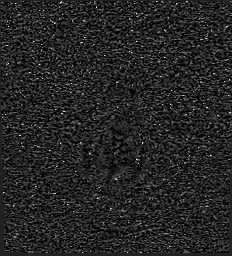

[Series 14: swi_images · axial · 3.0mm · 0.90mm/px · z∈[-93,+76]mm · 5 of 60 slices shown]
[im 1/60]
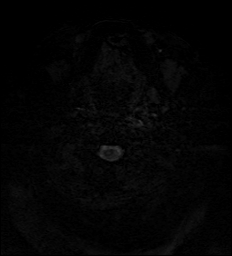
[im 15/60]
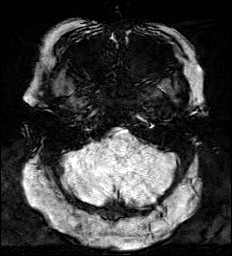
[im 30/60]
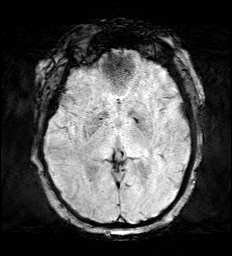
[im 45/60]
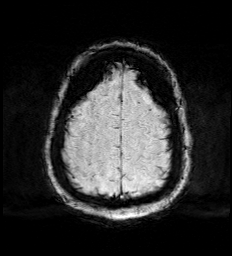
[im 60/60]
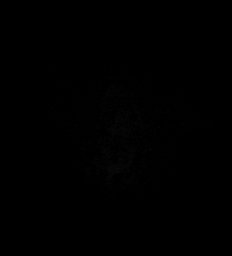

[Series 16: T1 · axial · 1.0mm · 0.98mm/px · z∈[-90,+78]mm · 12 of 176 slices shown (2 of 2)]
[im 1/176]
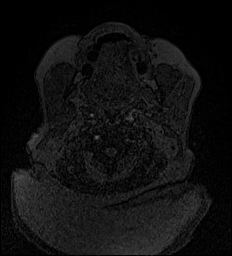
[im 14/176]
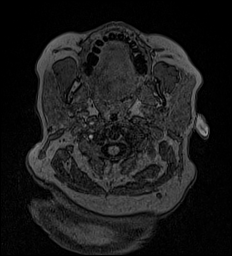
[im 27/176]
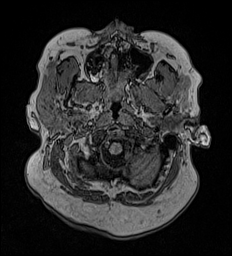
[im 41/176]
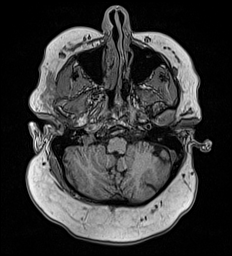
[im 54/176]
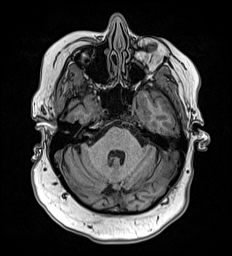
[im 68/176]
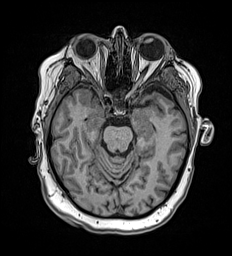
[im 81/176]
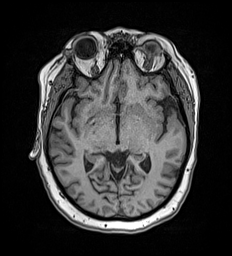
[im 95/176]
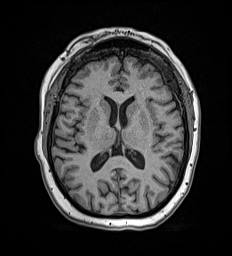
[im 108/176]
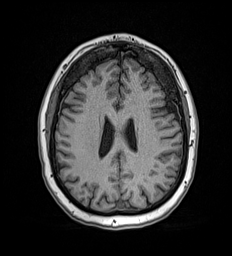
[im 122/176]
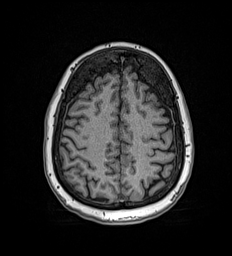
[im 149/176]
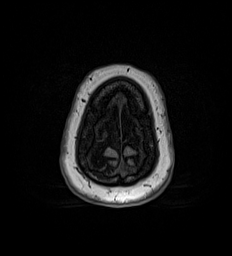
[im 176/176]
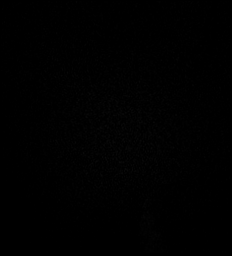

[Series 17: T2 · coronal · 5.0mm · 0.57mm/px · 2 of 29 slices shown (2 of 2)]
[im 1/29]
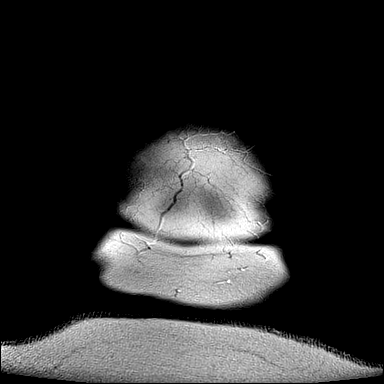
[im 29/29]
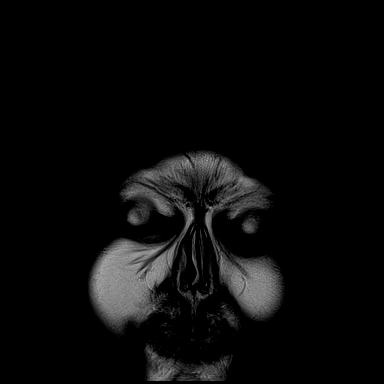

[46 of 48 positions shown; findings below may reference images not displayed]

FINDINGS: Brain: No restricted diffusion to suggest acute infarction. No
midline shift, mass effect, evidence of mass lesion,
ventriculomegaly, extra-axial collection or acute intracranial
hemorrhage. Cervicomedullary junction and pituitary are within
normal limits.

Gray and white matter signal is within normal limits for age
throughout the brain. No encephalomalacia or chronic cerebral blood
products identified. The deep gray nuclei, brainstem and cerebellum
appear normal.

Vascular: Major intracranial vascular flow voids are preserved, the
right vertebral artery appears dominant.

Skull and upper cervical spine: Hyperostosis of the calvarium,
normal variant. Visualized bone marrow signal is within normal
limits. Negative visible cervical spine.

Sinuses/Orbits: Negative orbits. Trace paranasal sinus mucosal
thickening, primarily in the maxillary alveolar recesses.

Other: Visible internal auditory structures appear normal and the
bilateral mastoid air cells are clear. Stylomastoid foramina appear
normal. Negative scalp and face soft tissues.
IMPRESSION: No acute intracranial abnormality. Normal for age noncontrast MRI
appearance of the brain.

## 2022-03-04 DIAGNOSIS — E119 Type 2 diabetes mellitus without complications: Secondary | ICD-10-CM | POA: Insufficient documentation

## 2022-03-30 LAB — LAB REPORT - SCANNED
A1c: 6.6
Albumin, Urine POC: 126.5
Creatinine, POC: 203.3 mg/dL
Microalb Creat Ratio: 62
TSH: 4.04 (ref 0.41–5.90)

## 2022-05-19 ENCOUNTER — Ambulatory Visit: Payer: BC Managed Care – PPO | Admitting: Podiatry

## 2022-05-19 ENCOUNTER — Encounter: Payer: Self-pay | Admitting: Podiatry

## 2022-05-19 DIAGNOSIS — L600 Ingrowing nail: Secondary | ICD-10-CM | POA: Diagnosis not present

## 2022-05-19 NOTE — Progress Notes (Signed)
Subjective:   Patient ID: Dana Murillo, female   DOB: 62 y.o.   MRN: [id number]   HPI Patient presents with painful ingrown toenail right big toe of at least 6 weeks duration has tried to trim and soak it without relief of symptoms states they have been some previous drainage redness but it seems calm down now but still sore.  Patient does not smoke likes to be active   Review of Systems  All other systems reviewed and are negative.       Objective:  Physical Exam Vitals and nursing note reviewed.  Constitutional:      Appearance: She is well-developed.  Pulmonary:     Effort: Pulmonary effort is normal.  Musculoskeletal:        General: Normal range of motion.  Skin:    General: Skin is warm.  Neurological:     Mental Status: She is alert.     Neurovascular status intact muscle strength found to be adequate range of motion adequate with incurvated lateral border right big toe painful when pressed with some proud flesh formation around this due to secondary previous infection.  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  Today I went ahead and I infiltrated the right hallux 60 mg like Marcaine mixture sterile prep done using sterile instrumentation remove the lateral border exposed matrix applied phenol 3 applications 30 seconds followed by alcohol lavage sterile dressing gave instructions on soaks and to wear dressing for 24 hours take it off earlier if throbbing were to occur and encouraged her to call with questions concerns which may arise

## 2022-05-19 NOTE — Patient Instructions (Signed)

## 2022-07-25 ENCOUNTER — Telehealth: Payer: BC Managed Care – PPO | Admitting: Family

## 2022-07-25 DIAGNOSIS — L03116 Cellulitis of left lower limb: Secondary | ICD-10-CM | POA: Diagnosis not present

## 2022-07-25 MED ORDER — SULFAMETHOXAZOLE-TRIMETHOPRIM 800-160 MG PO TABS: 1.0000 | ORAL_TABLET | Freq: Two times a day (BID) | ORAL | 0 refills | Status: DC

## 2022-07-25 NOTE — Progress Notes (Signed)
Virtual Visit Consent   Dana Murillo, you are scheduled for a virtual visit with a [facility] provider today. Just as with appointments in the office, your consent must be obtained to participate. Your consent will be active for this visit and any virtual visit you may have with one of our providers in the next 365 days. If you have a MyChart account, a copy of this consent can be sent to you electronically.  As this is a virtual visit, video technology does not allow for your provider to perform a traditional examination. This may limit your provider's ability to fully assess your condition. If your provider identifies any concerns that need to be evaluated in person or the need to arrange testing (such as labs, EKG, etc.), we will make arrangements to do so. Although advances in technology are sophisticated, we cannot ensure that it will always work on either your end or our end. If the connection with a video visit is poor, the visit may have to be switched to a telephone visit. With either a video or telephone visit, we are not always able to ensure that we have a secure connection.  By engaging in this virtual visit, you consent to the provision of healthcare and authorize for your insurance to be billed (if applicable) for the services provided during this visit. Depending on your insurance coverage, you may receive a charge related to this service.  I need to obtain your verbal consent now. Are you willing to proceed with your visit today? Dana Murillo has provided verbal consent on 07/25/2022 for a virtual visit (video or telephone). Jannifer Rodney, FNP  Date: 07/25/2022 2:06 PM  Virtual Visit via Video Note   I, Jannifer Rodney, connected with  Dana Murillo  ([id number], [birth year]) on 07/25/22 at  2:00 PM EDT by a video-enabled telemedicine application and verified that I am speaking with the correct person using two identifiers.  Location: Patient: Virtual Visit Location Patient:  Home Provider: Virtual Visit Location Provider: Home Office   I discussed the limitations of evaluation and management by telemedicine and the availability of in person appointments. The patient expressed understanding and agreed to proceed.    History of Present Illness: Dana Murillo is a 62 y.o. who identifies as a female who was assigned female at birth, and is being seen today for wound on left lower posterior leg. Reports she hit her leg about a month ago on her car. Reports the area blistered up. Reports yellow discharge, swelling, and tenderness. Reports intermittent aching pain of 7 out 10. Has tried compression hose and band aid without relief.   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   Seizure disorder (HCC) 05/03/2012   Microcalcifications of the breast 05/03/2012   Essential hypertension, benign 05/03/2012    Allergies: No Known Allergies Medications:  Current Outpatient Medications:    sulfamethoxazole-trimethoprim (BACTRIM DS) 800-160 MG tablet, Take 1 tablet by mouth 2 (two) times daily., Disp: 14 tablet, Rfl: 0   albuterol (VENTOLIN HFA) 108 (90 Base) MCG/ACT inhaler, Inhale into the lungs every 6 (six) hours as needed for wheezing or shortness of breath., Disp: , Rfl:    BREO ELLIPTA 100-25 MCG/INH AEPB, Inhale 1 puff into the lungs daily., Disp: , Rfl:    Calcium Carbonate-Vit D-Min (CALCIUM 1200 PO), Take 1 tablet by mouth daily. , Disp: , Rfl:    carvedilol (COREG)  25 MG tablet, Take 25 mg by mouth 2 (two) times daily with a meal. , Disp: , Rfl:    divalproex (DEPAKOTE) 250 MG DR tablet, Take 250-500 mg by mouth 3 (three) times daily. Take 250 at breakfast , 500 at lunch, and 500 in the evening, Disp: , Rfl:    fish oil-omega-3 fatty acids 1000 MG capsule, Take 4 g by mouth daily. , Disp: , Rfl:    glucosamine-chondroitin 500-400 MG tablet, Take 1 tablet by mouth daily. , Disp: , Rfl:    meclizine  (ANTIVERT) 25 MG tablet, Take 1 tablet (25 mg total) by mouth every 8 (eight) hours as needed for dizziness., Disp: 10 tablet, Rfl: 0   Multiple Vitamins-Minerals (MULTIVITAMIN WITH MINERALS) tablet, Take 1 tablet by mouth daily., Disp: , Rfl:    nystatin (MYCOSTATIN/NYSTOP) powder, Apply topically 4 (four) times daily., Disp: 15 g, Rfl: 0   pantoprazole (PROTONIX) 40 MG tablet, Take 1 tablet (40 mg total) by mouth daily., Disp: 30 tablet, Rfl: 1   simvastatin (ZOCOR) 40 MG tablet, Take 40 mg by mouth every evening., Disp: , Rfl:   Observations/Objective: Patient is well-developed, well-nourished in no acute distress.  Resting comfortably  at home.  Head is normocephalic, atraumatic.  No labored breathing.  Speech is clear and coherent with logical content.  Patient is alert and oriented at baseline.  Redness of left lower leg, swelling. Circular lesion approx quarter size that white, no discharge noted   Assessment and Plan: 1. Cellulitis of left lower extremity - sulfamethoxazole-trimethoprim (BACTRIM DS) 800-160 MG tablet; Take 1 tablet by mouth 2 (two) times daily.  Dispense: 14 tablet; Refill: 0  Keep clean and dry Start bactrim  Wrap legs  Needs to follow up with PCP this week since she is diabetic  Report any increase in size, pain, discharge, or fevers   Follow Up Instructions: I discussed the assessment and treatment plan with the patient. The patient was provided an opportunity to ask questions and all were answered. The patient agreed with the plan and demonstrated an understanding of the instructions.  A copy of instructions were sent to the patient via MyChart unless otherwise noted below.     The patient was advised to call back or seek an in-person evaluation if the symptoms worsen or if the condition fails to improve as anticipated.  Time:  I spent 12 minutes with the patient via telehealth technology discussing the above problems/concerns.    Jannifer Rodney, FNP

## 2022-10-09 LAB — LAB REPORT - SCANNED
A1c: 6.6
Albumin, Urine POC: 97
Creatinine, POC: 122.5 mg/dL
EGFR: 97
Microalb Creat Ratio: 79

## 2023-04-12 LAB — LAB REPORT - SCANNED
A1c: 6.7
Creatinine, POC: 143.3 mg/dL
EGFR: 83
Microalb Creat Ratio: 69
TSH: 3.62 (ref 0.41–5.90)

## 2023-08-22 LAB — HM DIABETES EYE EXAM

## 2023-09-02 ENCOUNTER — Telehealth: Payer: Self-pay | Admitting: Physician Assistant

## 2023-09-02 DIAGNOSIS — L03116 Cellulitis of left lower limb: Secondary | ICD-10-CM | POA: Diagnosis not present

## 2023-09-02 MED ORDER — AMOXICILLIN-POT CLAVULANATE 875-125 MG PO TABS: 1.0000 | ORAL_TABLET | Freq: Two times a day (BID) | ORAL | 0 refills | Status: DC

## 2023-09-02 NOTE — Patient Instructions (Signed)
 Dana Murillo, thank you for joining Delon CHRISTELLA Dickinson, PA-C for today's virtual visit.  While this provider is not your primary care provider (PCP), if your PCP is located in our provider database this encounter information will be shared with them immediately following your visit.   A [facility] MyChart account gives you access to today's visit and all your visits, tests, and labs performed at [facility]  click here if you don't have a [facility] MyChart account or go to [email or web address]  Consent: (Patient) Dana Murillo provided verbal consent for this virtual visit at the beginning of the encounter.  Current Medications:  Current Outpatient Medications:    amoxicillin-clavulanate (AUGMENTIN) 875-125 MG tablet, Take 1 tablet by mouth 2 (two) times daily., Disp: 20 tablet, Rfl: 0   acetaminophen  (TYLENOL  8 HOUR) 650 MG CR tablet, , Disp: , Rfl:    albuterol (VENTOLIN HFA) 108 (90 Base) MCG/ACT inhaler, Inhale into the lungs every 6 (six) hours as needed for wheezing or shortness of breath., Disp: , Rfl:    amLODipine (NORVASC) 5 MG tablet, Take 5 mg by mouth daily., Disp: , Rfl:    BREO ELLIPTA 100-25 MCG/INH AEPB, Inhale 1 puff into the lungs daily., Disp: , Rfl:    Calcium Carbonate-Vit D-Min (CALCIUM 1200 PO), Take 1 tablet by mouth daily. , Disp: , Rfl:    carvedilol (COREG) 25 MG tablet, Take 25 mg by mouth 2 (two) times daily with a meal. , Disp: , Rfl:    divalproex  (DEPAKOTE ) 250 MG DR tablet, Take 250-500 mg by mouth 3 (three) times daily. Take 250 at breakfast , 500 at lunch, and 500 in the evening, Disp: , Rfl:    empagliflozin (JARDIANCE) 25 MG TABS tablet, Take 25 mg by mouth daily., Disp: , Rfl:    fish oil-omega-3 fatty acids 1000 MG capsule, Take 4 g by mouth daily. , Disp: , Rfl:    glucosamine-chondroitin 500-400 MG tablet, Take 1 tablet by mouth daily. , Disp: , Rfl:    losartan (COZAAR) 25 MG tablet, Take 50 mg by mouth daily., Disp: , Rfl:     metFORMIN (GLUCOPHAGE) 500 MG tablet, Take 500 mg by mouth daily with breakfast., Disp: , Rfl:    Multiple Vitamins-Minerals (MULTIVITAMIN WITH MINERALS) tablet, Take 1 tablet by mouth daily., Disp: , Rfl:    Medications ordered in this encounter:  Meds ordered this encounter  Medications   amoxicillin-clavulanate (AUGMENTIN) 875-125 MG tablet    Sig: Take 1 tablet by mouth 2 (two) times daily.    Dispense:  20 tablet    Refill:  0    Supervising Provider:   LAMPTEY, PHILIP O [[id number]]     *If you need refills on other medications prior to your next appointment, please contact your pharmacy*  Follow-Up: Call back or seek an in-person evaluation if the symptoms worsen or if the condition fails to improve as anticipated.  [facility] [phone]  Other Instructions Cellulitis, Adult  Cellulitis is a skin infection. The infected area is usually warm, red, swollen, and tender. It most commonly occurs on the lower body, such as the legs, feet, and toes, but this condition can occur on any part of the body. The infection can travel to the muscles, blood, and underlying tissue and become life-threatening without treatment. It is important to get medical treatment right away for this condition. What are the causes? Cellulitis is caused by bacteria. The bacteria enter through a break in  the skin, such as a cut, burn, insect or animal bite, open sore, or crack. What increases the risk? This condition is more likely to occur in people who: Have a weak body's defense system (immune system). Are older than 65 years old. Have diabetes. Have a type of long-term (chronic) liver disease (cirrhosis) or kidney disease. Are obese. Have a skin condition such as: An itchy rash, such as eczema or psoriasis. A fungal rash on the feet or in skinfolds. Blistering rashes, such as shingles or chickenpox. Slow movement of blood in the veins (venous stasis). Fluid buildup below the skin  (edema). Have open wounds on the skin, such as cuts, puncture wounds, burns, bites, scrapes, tattoos, piercings, or wounds from surgery. Have had radiation therapy. Use IV drugs. What are the signs or symptoms? Symptoms of this condition include: Skin that looks red, purple, or slightly darker than your usual skin color. Streaks or spots on the skin. Swollen area of the skin. Tenderness or pain when an area of the skin is touched. Warm skin. Fever or chills. Blisters. Tiredness (fatigue). How is this diagnosed? This condition is diagnosed based on a medical history and physical exam. You may also have tests, including: Blood tests. Imaging tests. Tests on a sample of fluid taken from the wound (wound culture). How is this treated? Treatment for this condition may include: Medicines. These may include antibiotics or medicines to treat allergies (antihistamines). Rest. Applying cold or warm wet cloths (compresses) to the skin. If the condition is severe, you may need to stay in the hospital and get antibiotics through an IV. The infection usually starts to get better within 1-2 days of treatment. Follow these instructions at home: Medicines Take over-the-counter and prescription medicines only as told by your health care provider. If you were prescribed antibiotics, take them as told by your provider. Do not stop using the antibiotic even if you start to feel better. General instructions Drink enough fluid to keep your pee (urine) pale yellow. Do not touch or rub the infected area. Raise (elevate) the infected area above the level of your heart while you are sitting or lying down. Return to your normal activities as told by your provider. Ask your provider what activities are safe for you. Apply warm or cold compresses to the affected area as told by your provider. Keep all follow-up visits. Your provider will need to make sure that a more serious infection is not  developing. Contact a health care provider if: You have a fever. Your symptoms do not improve within 1-2 days of starting treatment or you develop new symptoms. Your bone or joint underneath the infected area becomes painful after the skin has healed. Your infection returns in the same area or another area. Signs of this may include: You notice a swollen bump in the infected area. Your red area gets larger, turns dark in color, or becomes more painful. Drainage increases. Pus or a bad smell develops in your infected area. You have more pain. You feel ill and have muscle aches and weakness. You develop vomiting or diarrhea that will not go away. Get help right away if: You notice red streaks coming from the infected area. You notice the skin turns purple or black and falls off. This symptom may be an emergency. Get help right away. Call 911. Do not wait to see if the symptom will go away. Do not drive yourself to the hospital. This information is not intended to replace advice given to  you by your health care provider. Make sure you discuss any questions you have with your health care provider. Document Revised: 09/15/2021 Document Reviewed: 09/15/2021 Elsevier Patient Education  2024 [employer]   If you have been instructed to have an in-person evaluation today at a local Urgent Care facility, please use the link below. It will take you to a list of all of our available [facility], including address, phone number and hours of operation. Please do not delay care.  [facility]  If you or a family member do not have a primary care provider, use the link below to schedule a visit and establish care. When you choose a [facility] primary care physician or advanced practice provider, you gain a long-term partner in health. Find a Primary Care Provider  Learn more about [facility] in-office and virtual care options: [facility] Get Care Now

## 2023-09-02 NOTE — Progress Notes (Signed)
 Virtual Visit Consent   Dana Murillo, you are scheduled for a virtual visit with a [facility] provider today. Just as with appointments in the office, your consent must be obtained to participate. Your consent will be active for this visit and any virtual visit you may have with one of our providers in the next 365 days. If you have a MyChart account, a copy of this consent can be sent to you electronically.  As this is a virtual visit, video technology does not allow for your provider to perform a traditional examination. This may limit your provider's ability to fully assess your condition. If your provider identifies any concerns that need to be evaluated in person or the need to arrange testing (such as labs, EKG, etc.), we will make arrangements to do so. Although advances in technology are sophisticated, we cannot ensure that it will always work on either your end or our end. If the connection with a video visit is poor, the visit may have to be switched to a telephone visit. With either a video or telephone visit, we are not always able to ensure that we have a secure connection.  By engaging in this virtual visit, you consent to the provision of healthcare and authorize for your insurance to be billed (if applicable) for the services provided during this visit. Depending on your insurance coverage, you may receive a charge related to this service.  I need to obtain your verbal consent now. Are you willing to proceed with your visit today? Dana Murillo has provided verbal consent on 09/02/2023 for a virtual visit (video or telephone). Dana CHRISTELLA Dickinson, PA-C  Date: 09/02/2023 6:19 PM   Virtual Visit via Video Note   I, Dana Murillo, connected with  Dana Murillo  ([id number], [birth year]) on 09/02/23 at  5:45 PM EDT by a video-enabled telemedicine application and verified that I am speaking with the correct person using two identifiers.  Location: Patient: Virtual Visit Location  Patient: Home Provider: Virtual Visit Location Provider: Home Office   I discussed the limitations of evaluation and management by telemedicine and the availability of in person appointments. The patient expressed understanding and agreed to proceed.    History of Present Illness: Dana Murillo is a 63 y.o. who identifies as a female who was assigned female at birth, and is being seen today for leg injury. About a month ago she scraped the back of the leg. Reports it was healing well, but then about a week ago she did the same thing. This time it is not healing well. She has done this twice in the past last year and required antibiotic treatment. She has chronic leg swelling and cellulitis and sometimes with injury it gets flared and infected. She has been applying neosporin then covering with a non-adherent pad, then wrapping. Reports it is more painful and draining a little at this time. Denies fevers, chills, nausea, vomiting.    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/2014   Seizure disorder (HCC) 05/03/2012   Microcalcifications of the breast 05/03/2012   Essential hypertension, benign 05/03/2012    Allergies: No Known Allergies Medications:  Current Outpatient Medications:    amoxicillin-clavulanate (AUGMENTIN) 875-125 MG tablet, Take 1 tablet by mouth 2 (two) times daily., Disp: 20 tablet, Rfl: 0   acetaminophen  (TYLENOL  8 HOUR) 650 MG CR tablet, , Disp: , Rfl:    albuterol (VENTOLIN HFA) 108 (  90 Base) MCG/ACT inhaler, Inhale into the lungs every 6 (six) hours as needed for wheezing or shortness of breath., Disp: , Rfl:    amLODipine (NORVASC) 5 MG tablet, Take 5 mg by mouth daily., Disp: , Rfl:    BREO ELLIPTA 100-25 MCG/INH AEPB, Inhale 1 puff into the lungs daily., Disp: , Rfl:    Calcium Carbonate-Vit D-Min (CALCIUM 1200 PO), Take 1 tablet by mouth daily. , Disp: , Rfl:    carvedilol (COREG) 25 MG tablet, Take 25 mg by mouth 2  (two) times daily with a meal. , Disp: , Rfl:    divalproex  (DEPAKOTE ) 250 MG DR tablet, Take 250-500 mg by mouth 3 (three) times daily. Take 250 at breakfast , 500 at lunch, and 500 in the evening, Disp: , Rfl:    empagliflozin (JARDIANCE) 25 MG TABS tablet, Take 25 mg by mouth daily., Disp: , Rfl:    fish oil-omega-3 fatty acids 1000 MG capsule, Take 4 g by mouth daily. , Disp: , Rfl:    glucosamine-chondroitin 500-400 MG tablet, Take 1 tablet by mouth daily. , Disp: , Rfl:    losartan (COZAAR) 25 MG tablet, Take 50 mg by mouth daily., Disp: , Rfl:    metFORMIN (GLUCOPHAGE) 500 MG tablet, Take 500 mg by mouth daily with breakfast., Disp: , Rfl:    Multiple Vitamins-Minerals (MULTIVITAMIN WITH MINERALS) tablet, Take 1 tablet by mouth daily., Disp: , Rfl:   Observations/Objective: Patient is well-developed, well-nourished in no acute distress.  Resting comfortably at home.  Head is normocephalic, atraumatic.  No labored breathing.  Speech is clear and coherent with logical content.  Patient is alert and oriented at baseline.    Assessment and Plan: 1. Cellulitis of left lower extremity (Primary) - amoxicillin-clavulanate (AUGMENTIN) 875-125 MG tablet; Take 1 tablet by mouth 2 (two) times daily.  Dispense: 20 tablet; Refill: 0  - Suspected cellulitis and infected wound on the posterior left lower leg - Will give Augmentin  - Continue to elevate leg when at rest - Cover when draining - Advised does not need to use Neosporin any longer as it can inhibit healing  - Wash leg with soap and water and pat dry once or twice daily, or if becomes dirty - Cover as needed - Compression stockings may help, but cover with bandage before applying compression stockings - Seek in person evaluation if worsening or if fails to improve  Follow Up Instructions: I discussed the assessment and treatment plan with the patient. The patient was provided an opportunity to ask questions and all were answered.  The patient agreed with the plan and demonstrated an understanding of the instructions.  A copy of instructions were sent to the patient via MyChart unless otherwise noted below.    The patient was advised to call back or seek an in-person evaluation if the symptoms worsen or if the condition fails to improve as anticipated.    Dana CHRISTELLA Dickinson, PA-C

## 2023-10-05 ENCOUNTER — Ambulatory Visit
Admission: RE | Admit: 2023-10-05 | Discharge: 2023-10-05 | Disposition: A | Discharge status: Home / Self Care | Source: Ambulatory Visit

## 2023-10-05 ENCOUNTER — Ambulatory Visit: Payer: Self-pay

## 2023-10-05 VITALS — BP 137/59 | HR 70 | Ht 62.0 in | Wt 258.4 lb

## 2023-10-05 DIAGNOSIS — E1121 Type 2 diabetes mellitus with diabetic nephropathy: Secondary | ICD-10-CM

## 2023-10-05 DIAGNOSIS — I10 Essential (primary) hypertension: Secondary | ICD-10-CM | POA: Insufficient documentation

## 2023-10-05 DIAGNOSIS — I872 Venous insufficiency (chronic) (peripheral): Secondary | ICD-10-CM | POA: Diagnosis not present

## 2023-10-05 DIAGNOSIS — R0602 Shortness of breath: Secondary | ICD-10-CM | POA: Diagnosis present

## 2023-10-05 DIAGNOSIS — E782 Mixed hyperlipidemia: Secondary | ICD-10-CM

## 2023-10-05 DIAGNOSIS — R609 Edema, unspecified: Secondary | ICD-10-CM

## 2023-10-05 DIAGNOSIS — Z23 Encounter for immunization: Secondary | ICD-10-CM | POA: Diagnosis not present

## 2023-10-05 DIAGNOSIS — Z7984 Long term (current) use of oral hypoglycemic drugs: Secondary | ICD-10-CM

## 2023-10-05 DIAGNOSIS — E1169 Type 2 diabetes mellitus with other specified complication: Secondary | ICD-10-CM

## 2023-10-05 DIAGNOSIS — E785 Hyperlipidemia, unspecified: Secondary | ICD-10-CM | POA: Insufficient documentation

## 2023-10-05 DIAGNOSIS — J4540 Moderate persistent asthma, uncomplicated: Secondary | ICD-10-CM

## 2023-10-05 DIAGNOSIS — G40909 Epilepsy, unspecified, not intractable, without status epilepticus: Secondary | ICD-10-CM | POA: Diagnosis not present

## 2023-10-05 MED ORDER — BUDESONIDE-FORMOTEROL FUMARATE 160-4.5 MCG/ACT IN AERO: 2.0000 | INHALATION_SPRAY | Freq: Two times a day (BID) | RESPIRATORY_TRACT | 12 refills | Status: DC

## 2023-10-05 NOTE — Progress Notes (Unsigned)
 New Patient Visit   Physician: Delma Villalva A Oluwademilade Mckiver, MD  Patient: Dana Murillo   DOB: [birth year]   63 y.o. Female  MRN: [id number] Visit Date: 10/05/2023   Chief Complaint  Patient presents with   Establish Care   Subjective  Dana Murillo is a 63 y.o. female who presents today as a new patient to establish care.   HPI  Discussed the use of AI scribe software for clinical note transcription with the patient, who gave verbal consent to proceed.  History of Present Illness   Dana Murillo is a 63 year old female with chronic venous insufficiency who presents with recurrent dermatitis and cellulitis on her legs.  Lower extremity dermatitis and cellulitis - Recurrent dermatitis with weeping and skin splitting on the legs, predominantly the left, for the past 4-5 years - Exacerbation with minor injuries and during summer months with increased outdoor activity - Dermatitis is cosmetically concerning but not painful - Cellulitis occurs at the site of dermatitis, requiring antibiotic treatment in the past - Recurrence of condition despite bandaging - Does not use compression stockings regularly - Does not elevate legs - No venous ultrasound performed  Seizure disorder - Seizures controlled with Depakote  - One to two minor seizures per year - absence seizures by clinical presentation  - Neuro seen and workup done at initial dx  Type 2 diabetes mellitus - Managed with Jardiance and metformin - Recent hemoglobin A1c of 6.7  - No neuropathy ++ microalbuminuria  Hypertension - Blood pressure stable on losartan and carvedilol  Diabetic nephropathy - Microalbuminuria managed with losartan  Hyperlipidemia - Takes rosuvastatin for cholesterol management  Asthma - Uses Breo and a rescue inhaler - Experiences nocturnal coughing -   - Albuterol use daily        ASSESSMENT & PLAN  Encounter Diagnoses  Name Primary?   Seizure disorder (HCC) Yes   Essential  hypertension, benign    Shortness of breath    Venous stasis dermatitis    Type 2 diabetes mellitus with other specified complication, without long-term current use of insulin (HCC)    Morbid obesity (HCC)    Mixed hyperlipidemia    Moderate persistent asthma, unspecified whether complicated    Pitting edema    Diabetic nephropathy associated with type 2 diabetes mellitus (HCC)     Orders Placed This Encounter  Procedures   DG Chest 2 View   CBC with Differential/Platelet   Comprehensive metabolic panel with GFR   Hemoglobin A1c   Lipid panel   Urinalysis, Routine w reflex microscopic   Brain natriuretic peptide    Assessment and Plan    Chronic venous insufficiency with stasis dermatitis and recurrent cellulitis Chronic venous insufficiency with stasis dermatitis and recurrent cellulitis, particularly affecting the left leg for four to five years. Symptoms include weeping, skin splitting, and recurrent cellulitis, exacerbated by injuries. No pain reported. Compression and wrapping have not been consistently used. Potential venous insufficiency contributing to symptoms. No prior ultrasound of leg veins performed. -  Query CHF induced contribution given lung findings on examination - Recommend use of compression stockings to improve venous flow. - Advise wrapping with ACE bandages to reduce swelling and prevent cellulitis. - Provide dressings to keep wounds moist and covered to promote healing. Xeroform - Educate on keeping legs elevated and increasing walking to mobilize fluid.  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 Hemoglobin A1c is 6.7,  indicating good control. No reported neuropathy or retinopathy. Losartan prescribed for kidney protection due to microalbuminuria. - Continue current diabetes medications: Jardiance and metformin. - Continue losartan for kidney protection. -  Monitor albumin creatinine ratio every six months. - Encourage adherence to diabetic diet and increase physical activity.  Essential hypertension Essential hypertension, currently managed with losartan, carvedilol, and amlodipine. Blood pressure reported as stable. Plan to reduce the number of antihypertensive medications while maintaining control. - Continue current antihypertensive medications: losartan, carvedilol, and amlodipine. - Plan to reduce the number of antihypertensive medications in the future.  Asthma Asthma, currently managed with Breo and a rescue inhaler. Breo not covered by Medicare. Rescue inhaler used daily. No history of hospitalization for asthma. - Switch from Breo to Symbicort  or another covered inhaler. - Monitor for increased use of rescue inhaler after switch.  Epilepsy, not intractable, without status epilepticus Epilepsy, well-controlled with Depakote . Experiences one to two minor seizures per year, described as petit mal seizures with no significant impact on daily functioning. No recent neurology follow-up, but condition remains stable. - Continue Depakote  as prescribed.  Shortness of breath, under evaluation for etiology Shortness of breath with crackles noted on lung examination. Possible fluid retention or heart function issues. No history of chest pain or palpitations. Shortness of breath when laying flat. - Order chest x-ray to evaluate for fluid in the lungs. - Order blood work to assess for underlying causes of shortness of breath.  BNP/ProBNP  Morbid obesity - Patient sedentary   F/u in 2 weeks with labs, CXR today            Objective  BP (!) 137/59 (BP Location: Right Arm, Patient Position: Sitting)   Pulse 70   Ht 5' 2 (1.575 m)   Wt 258 lb 6 oz (117.2 kg)   SpO2 96%   BMI 47.26 kg/m  {Insert last BP/Wt (optional):23777}{See vitals history (optional):1}    Review of Systems  Constitutional:  Negative for chills, fever and weight  loss.  Eyes:  Negative for blurred vision. h Respiratory:  Negative for cough and shortness of breath.   Cardiovascular:  Negative for chest pain and palpitations.  Skin:  Negative for rash.  Psychiatric/Behavioral:  Negative for depression. The patient is not nervous/anxious.      Physical Exam Physical Exam Vitals reviewed.  Constitutional:      Appearance: Normal appearance. Well-developed, obese HENT:     Head: Normocephalic and atraumatic.  Normal mucous membranes, no oral lesions Eyes:     Pupils: Pupils are equal, round, and reactive to light.  Neck:     Thyroid: No thyroid mass or thyromegaly.  Cardiovascular:     Rate and Rhythm: Normal rate and regular rhythm. Normal heart sounds. Normal peripheral pulses Pulmonary:     Bilateral crackles at base of lungs, fine Abdominal:   Abdomen is soft, without tenderness or noted hepatosplenomegaly Musculoskeletal:        General: No swelling or edema  Lymphadenopathy:     Cervical: No cervical adenopathy.  Skin:    General: Skin is warm and dry without noticeable rash. Neurological:     General: No focal deficit present.  Psychiatric:        Mood and Affect: Mood, behavior and cognition normal   Past Medical History:  Diagnosis Date   Arthritis    Asthma    Diabetes (HCC)    Epilepsia    Gastric ulcer    High cholesterol    Hypertension  Seizure disorder [facility])    Past Surgical History:  Procedure Laterality Date   ABDOMINAL HYSTERECTOMY     BREAST BIOPSY Right 10/2012   BENIGN BREAST TISSUE  FOCAL COMPLEX SCLEROSING LESION   BREAST SURGERY Right 2009   COLONOSCOPY WITH PROPOFOL  N/A 06/06/2020   Procedure: COLONOSCOPY WITH PROPOFOL ;  Surgeon: Dessa Reyes ORN, MD;  Location: [facility] ENDOSCOPY;  Service: Endoscopy;  Laterality: N/A;   ESOPHAGOGASTRODUODENOSCOPY (EGD) WITH PROPOFOL  N/A 06/06/2020   Procedure: ESOPHAGOGASTRODUODENOSCOPY (EGD) WITH PROPOFOL ;  Surgeon: Dessa Reyes ORN, MD;  Location: [facility] ENDOSCOPY;   Service: Endoscopy;  Laterality: N/A;   ESOPHAGOGASTRODUODENOSCOPY (EGD) WITH PROPOFOL  N/A 08/08/2020   Procedure: ESOPHAGOGASTRODUODENOSCOPY (EGD) WITH PROPOFOL ;  Surgeon: Dessa Reyes ORN, MD;  Location: [facility] ENDOSCOPY;  Service: Endoscopy;  Laterality: N/A;   REPLACEMENT TOTAL KNEE Bilateral 2013,2014   Dr VEAR Pinal   Family Status  Relation Name Status   Mother  Deceased   Father  Deceased  No partnership data on file   Family History  Problem Relation Age of Onset   Diabetes Mother        33 yrs   Dementia Mother    Heart disease Father        51 yrs   Diabetes Father    Colon polyps Father    Social History   Socioeconomic History   Marital status: Married    Spouse name: Not on file   Number of children: Not on file   Years of education: Not on file   Highest education level: Bachelor's degree (e.g., BA, AB, BS)  Occupational History   Not on file  Tobacco Use   Smoking status: Never   Smokeless tobacco: Never  Vaping Use   Vaping status: Never Used  Substance and Sexual Activity   Alcohol use: Never   Drug use: Never   Sexual activity: Not on file  Other Topics Concern   Not on file  Social History Narrative   Not on file   Social Drivers of Health   Financial Resource Strain: Low Risk  (10/04/2023)   Overall Financial Resource Strain (CARDIA)    Difficulty of Paying Living Expenses: Not hard at all  Food Insecurity: No Food Insecurity (10/04/2023)   Hunger Vital Sign    Worried About Running Out of Food in the Last Year: Never true    Ran Out of Food in the Last Year: Never true  Transportation Needs: No Transportation Needs (10/04/2023)   PRAPARE - Administrator, Civil Service (Medical): No    Lack of Transportation (Non-Medical): No  Physical Activity: Insufficiently Active (10/04/2023)   Exercise Vital Sign    Days of Exercise per Week: 2 days    Minutes of Exercise per Session: 10 min  Stress: No Stress Concern Present (10/04/2023)    [employer] of Occupational Health - Occupational Stress Questionnaire    Feeling of Stress: Only a little  Social Connections: Socially Integrated (10/04/2023)   Social Connection and Isolation Panel    Frequency of Communication with Friends and Family: More than three times a week    Frequency of Social Gatherings with Friends and Family: Three times a week    Attends Religious Services: More than 4 times per year    Active Member of Clubs or Organizations: Yes    Attends Banker Meetings: More than 4 times per year    Marital Status: Married   Outpatient Medications Prior to Visit  Medication Sig   albuterol (  VENTOLIN HFA) 108 (90 Base) MCG/ACT inhaler Inhale into the lungs every 6 (six) hours as needed for wheezing or shortness of breath.   amLODipine (NORVASC) 5 MG tablet Take 5 mg by mouth daily.   BREO ELLIPTA 100-25 MCG/INH AEPB Inhale 1 puff into the lungs daily.   Calcium Carbonate-Vit D-Min (CALCIUM 1200 PO) Take 1 tablet by mouth daily.    carvedilol (COREG) 25 MG tablet Take 25 mg by mouth 2 (two) times daily with a meal.    divalproex  (DEPAKOTE ) 250 MG DR tablet Take 250-500 mg by mouth 3 (three) times daily. Take 250 at breakfast , 500 at lunch, and 500 in the evening   empagliflozin (JARDIANCE) 25 MG TABS tablet Take 25 mg by mouth daily.   fish oil-omega-3 fatty acids 1000 MG capsule Take 4 g by mouth daily.    glucosamine-chondroitin 500-400 MG tablet Take 1 tablet by mouth daily.    losartan (COZAAR) 25 MG tablet Take 50 mg by mouth daily.   magnesium gluconate (MAGONATE) 500 (27 Mg) MG TABS tablet Take 480 mg by mouth daily.   metFORMIN (GLUCOPHAGE) 500 MG tablet Take 500 mg by mouth daily with breakfast.   Multiple Vitamins-Minerals (MULTIVITAMIN WITH MINERALS) tablet Take 1 tablet by mouth daily.   rosuvastatin (CRESTOR) 20 MG tablet Take 20 mg by mouth daily.   acetaminophen  (TYLENOL  8 HOUR) 650 MG CR tablet    amoxicillin -clavulanate (AUGMENTIN )  875-125 MG tablet Take 1 tablet by mouth 2 (two) times daily. (Patient not taking: Reported on 10/05/2023)   [DISCONTINUED] meclizine  (ANTIVERT ) 25 MG tablet Take 1 tablet (25 mg total) by mouth every 8 (eight) hours as needed for dizziness.   [DISCONTINUED] nystatin  (MYCOSTATIN /NYSTOP ) powder Apply topically 4 (four) times daily.   [DISCONTINUED] pantoprazole  (PROTONIX ) 40 MG tablet Take 1 tablet (40 mg total) by mouth daily.   [DISCONTINUED] simvastatin (ZOCOR) 40 MG tablet Take 40 mg by mouth every evening.   [DISCONTINUED] sulfamethoxazole -trimethoprim  (BACTRIM  DS) 800-160 MG tablet Take 1 tablet by mouth 2 (two) times daily.   No facility-administered medications prior to visit.   No Known Allergies  Immunization History  Administered Date(s) Administered   Influenza,inj,Quad PF,6+ Mos 10/26/2018   Moderna Sars-Covid-2 Vaccination 03/31/2019, 04/28/2019    Health Maintenance  Topic Date Due   Medicare Annual Wellness (AWV)  Never done   FOOT EXAM  Never done   HIV Screening  Never done   Hepatitis C Screening  Never done   DTaP/Tdap/Td (1 - Tdap) Never done   Pneumococcal Vaccine: 50+ Years (1 of 2 - PCV) Never done   Cervical Cancer Screening (HPV/Pap Cotest)  Never done   Zoster Vaccines- Shingrix (1 of 2) Never done   MAMMOGRAM  08/21/2017   DEXA SCAN  Never done   INFLUENZA VACCINE  09/02/2023   COVID-19 Vaccine (3 - 2025-26 season) 10/03/2023   HEMOGLOBIN A1C  10/12/2023   Diabetic kidney evaluation - eGFR measurement  04/10/2024   Diabetic kidney evaluation - Urine ACR  04/10/2024   OPHTHALMOLOGY EXAM  08/21/2024   Colonoscopy  06/07/2030   Hepatitis B Vaccines 19-59 Average Risk  Aged Out   HPV VACCINES  Aged Out   Meningococcal B Vaccine  Aged Out    Patient Care Team: Corlis Honor BROCKS, MD as PCP - General (Unknown Physician Specialty) Dessa Reyes ORN, MD (General Surgery) Corlis Honor BROCKS, MD (Internal Medicine)  Depression Screen     No data to display  Parris DELENA Juneau, MD  [facility] [phone] (phone) [phone] (fax)  [facility]

## 2023-10-06 ENCOUNTER — Other Ambulatory Visit: Payer: Self-pay

## 2023-10-06 ENCOUNTER — Ambulatory Visit: Payer: Self-pay

## 2023-10-06 DIAGNOSIS — Z1231 Encounter for screening mammogram for malignant neoplasm of breast: Secondary | ICD-10-CM

## 2023-10-06 DIAGNOSIS — G40909 Epilepsy, unspecified, not intractable, without status epilepticus: Secondary | ICD-10-CM

## 2023-10-07 ENCOUNTER — Other Ambulatory Visit: Payer: Self-pay

## 2023-10-07 DIAGNOSIS — E119 Type 2 diabetes mellitus without complications: Secondary | ICD-10-CM | POA: Insufficient documentation

## 2023-10-10 ENCOUNTER — Other Ambulatory Visit

## 2023-10-10 DIAGNOSIS — R0602 Shortness of breath: Secondary | ICD-10-CM

## 2023-10-10 DIAGNOSIS — E1169 Type 2 diabetes mellitus with other specified complication: Secondary | ICD-10-CM

## 2023-10-10 DIAGNOSIS — G40909 Epilepsy, unspecified, not intractable, without status epilepticus: Secondary | ICD-10-CM

## 2023-10-10 DIAGNOSIS — I872 Venous insufficiency (chronic) (peripheral): Secondary | ICD-10-CM

## 2023-10-10 DIAGNOSIS — I10 Essential (primary) hypertension: Secondary | ICD-10-CM

## 2023-10-11 LAB — COMPREHENSIVE METABOLIC PANEL WITH GFR
AG Ratio: 1.6 (calc) (ref 1.0–2.5)
ALT: 16 U/L (ref 6–29)
AST: 14 U/L (ref 10–35)
Albumin: 3.9 g/dL (ref 3.6–5.1)
Alkaline phosphatase (APISO): 47 U/L (ref 37–153)
BUN: 18 mg/dL (ref 7–25)
CO2: 31 mmol/L (ref 20–32)
Calcium: 9 mg/dL (ref 8.6–10.4)
Chloride: 102 mmol/L (ref 98–110)
Creat: 0.76 mg/dL (ref 0.50–1.05)
Globulin: 2.4 g/dL (ref 1.9–3.7)
Glucose, Bld: 112 mg/dL — ABNORMAL HIGH (ref 65–99)
Potassium: 4.4 mmol/L (ref 3.5–5.3)
Sodium: 141 mmol/L (ref 135–146)
Total Bilirubin: 0.4 mg/dL (ref 0.2–1.2)
Total Protein: 6.3 g/dL (ref 6.1–8.1)
eGFR: 87 mL/min/1.73m2 (ref >=60)

## 2023-10-11 LAB — CBC WITH DIFFERENTIAL/PLATELET
Absolute Lymphocytes: 2029 {cells}/uL (ref 850–3900)
Absolute Monocytes: 552 {cells}/uL (ref 200–950)
Basophils Absolute: 48 {cells}/uL (ref 0–200)
Basophils Relative: 0.7 %
Eosinophils Absolute: 221 {cells}/uL (ref 15–500)
Eosinophils Relative: 3.2 %
HCT: 47.6 % — ABNORMAL HIGH (ref 35.0–45.0)
Hemoglobin: 15.1 g/dL (ref 11.7–15.5)
MCH: 28.6 pg (ref 27.0–33.0)
MCHC: 31.7 g/dL — ABNORMAL LOW (ref 32.0–36.0)
MCV: 90.2 fL (ref 80.0–100.0)
MPV: 9.8 fL (ref 7.5–12.5)
Monocytes Relative: 8 %
Neutro Abs: 4050 {cells}/uL (ref 1500–7800)
Neutrophils Relative %: 58.7 %
Platelets: 199 Thousand/uL (ref 140–400)
RBC: 5.28 Million/uL — ABNORMAL HIGH (ref 3.80–5.10)
RDW: 15.4 % — ABNORMAL HIGH (ref 11.0–15.0)
Total Lymphocyte: 29.4 %
WBC: 6.9 Thousand/uL (ref 3.8–10.8)

## 2023-10-11 LAB — MICROSCOPIC MESSAGE

## 2023-10-11 LAB — URINALYSIS, ROUTINE W REFLEX MICROSCOPIC
Bilirubin Urine: NEGATIVE
Hgb urine dipstick: NEGATIVE
Hyaline Cast: NONE SEEN /LPF
Nitrite: NEGATIVE
Specific Gravity, Urine: 1.026 (ref 1.001–1.035)
pH: 7.5 (ref 5.0–8.0)

## 2023-10-11 LAB — BRAIN NATRIURETIC PEPTIDE: Brain Natriuretic Peptide: 78 pg/mL (ref <100)

## 2023-10-11 LAB — HEMOGLOBIN A1C
Hgb A1c MFr Bld: 6.9 % — ABNORMAL HIGH (ref <5.7)
Mean Plasma Glucose: 151 mg/dL
eAG (mmol/L): 8.4 mmol/L

## 2023-10-11 LAB — LIPID PANEL
Cholesterol: 113 mg/dL (ref <200)
HDL: 51 mg/dL (ref >=50)
LDL Cholesterol (Calc): 36 mg/dL
Non-HDL Cholesterol (Calc): 62 mg/dL (ref <130)
Total CHOL/HDL Ratio: 2.2 (calc) (ref <5.0)
Triglycerides: 180 mg/dL — ABNORMAL HIGH (ref <150)

## 2023-10-11 LAB — VALPROIC ACID LEVEL: Valproic Acid Lvl: 59.7 mg/L (ref 50.0–100.0)

## 2023-10-11 LAB — MAGNESIUM: Magnesium: 2.2 mg/dL (ref 1.5–2.5)

## 2023-10-19 ENCOUNTER — Telehealth: Payer: Self-pay

## 2023-10-19 ENCOUNTER — Ambulatory Visit

## 2023-10-19 VITALS — BP 132/74 | HR 65 | Ht 62.0 in | Wt 256.0 lb

## 2023-10-19 DIAGNOSIS — I10 Essential (primary) hypertension: Secondary | ICD-10-CM

## 2023-10-19 DIAGNOSIS — R0602 Shortness of breath: Secondary | ICD-10-CM | POA: Diagnosis not present

## 2023-10-19 DIAGNOSIS — R609 Edema, unspecified: Secondary | ICD-10-CM

## 2023-10-19 DIAGNOSIS — J4540 Moderate persistent asthma, uncomplicated: Secondary | ICD-10-CM

## 2023-10-19 DIAGNOSIS — Z7984 Long term (current) use of oral hypoglycemic drugs: Secondary | ICD-10-CM | POA: Diagnosis not present

## 2023-10-19 DIAGNOSIS — E1169 Type 2 diabetes mellitus with other specified complication: Secondary | ICD-10-CM

## 2023-10-19 NOTE — Progress Notes (Signed)
 Progress Note  Physician: Zykera Abella A Renesmay Nesbitt, MD   HPI: Dana Murillo is a 63 y.o. female presenting on 10/19/2023 for Follow-up .  Discussed the use of AI scribe software for clinical note transcription with the patient, who gave verbal consent to proceed.  History of Present Illness   Dana Murillo is a 63 year old female with asthma and diabetes who presents with issues related to medication coverage and breathing difficulties.  Dyspnea and asthma management - Exertional dyspnea limiting physical activities such as walking at the beach - Able to walk at the gym with improvement in breathing - No participation in overly strenuous activities in the past two weeks - Currently using Symbicort  inhaler due to insurance not covering Breo; insurance requires confirmation that Symbicort  is a maintenance medication - Some improvement with symbicort   - Morbid obesity exacerbating dypnea generally   Diabetes mellitus management - Currently taking metformin 500 mg and Jardiance 25 mg - Hemoglobin A1c is 6.9, slightly increased from 6.8 previously - Follows a diabetic diet 'mostly - No diarrhea with metformin use  Peripheral edema and cellulitis - History of leg swelling managed with seriforms and leg wrapping - Cellulitis episodes occur particularly in the summer, associated with increased outdoor activity and minor injuries leading to infection  - Using xeroform with open wounds which is helpful      Medical history:  Relevant past medical, surgical, family and social history reviewed and updated as indicated. Interim medical history since our last visit reviewed.  Allergies and medications reviewed and updated.   ROS: Negative unless specifically indicated above in HPI.    Current Outpatient Medications:    acetaminophen  (TYLENOL  8 HOUR) 650 MG CR tablet, , Disp: , Rfl:    albuterol (VENTOLIN HFA) 108 (90 Base) MCG/ACT inhaler, Inhale into the lungs  every 6 (six) hours as needed for wheezing or shortness of breath., Disp: , Rfl:    amLODipine (NORVASC) 5 MG tablet, Take 5 mg by mouth daily., Disp: , Rfl:    budesonide -formoterol  (SYMBICORT ) 160-4.5 MCG/ACT inhaler, Inhale 2 puffs into the lungs 2 (two) times daily., Disp: 1 each, Rfl: 12   Calcium Carbonate-Vit D-Min (CALCIUM 1200 PO), Take 1 tablet by mouth daily. , Disp: , Rfl:    carvedilol (COREG) 25 MG tablet, Take 25 mg by mouth 2 (two) times daily with a meal. , Disp: , Rfl:    divalproex  (DEPAKOTE ) 250 MG DR tablet, Take 250-500 mg by mouth 3 (three) times daily. Take 250 at breakfast , 500 at lunch, and 500 in the evening, Disp: , Rfl:    empagliflozin (JARDIANCE) 25 MG TABS tablet, Take 25 mg by mouth daily., Disp: , Rfl:    fish oil-omega-3 fatty acids 1000 MG capsule, Take 4 g by mouth daily. , Disp: , Rfl:    glucosamine-chondroitin 500-400 MG tablet, Take 1 tablet by mouth daily. , Disp: , Rfl:    losartan (COZAAR) 50 MG tablet, Take 50 mg by mouth daily., Disp: , Rfl:    magnesium gluconate (MAGONATE) 500 (27 Mg) MG TABS tablet, Take 480 mg by mouth daily., Disp: , Rfl:    metFORMIN (GLUCOPHAGE) 500 MG tablet, Take 500 mg by mouth daily with breakfast., Disp: , Rfl:    Multiple Vitamins-Minerals (MULTIVITAMIN WITH MINERALS) tablet, Take 1 tablet by mouth daily., Disp: , Rfl:    rosuvastatin (CRESTOR) 20 MG tablet, Take 20 mg by mouth daily.,  Disp: , Rfl:        Objective:     BP 132/74 (BP Location: Left Arm, Patient Position: Sitting, Cuff Size: Large)   Pulse 65   Ht 5' 2 (1.575 m)   Wt 256 lb (116.1 kg)   SpO2 96%   BMI 46.82 kg/m   Wt Readings from Last 3 Encounters:  10/19/23 256 lb (116.1 kg)  10/05/23 258 lb 6 oz (117.2 kg)  08/08/20 260 lb (117.9 kg)    Physical Exam  Physical Exam Vitals reviewed.  Constitutional:      Appearance: Normal appearance. Well-developed with normal weight.  Cardiovascular:     Rate and Rhythm: Normal rate and regular  rhythm. Normal heart sounds. Normal peripheral pulses Pulmonary:     Normal breath sounds with normal effort Skin:    General: Skin is warm and dry without noticeable rash. Neurological:     General: No focal deficit present.  Psychiatric:        Mood and Affect: Mood, behavior and cognition normal      Assessment & Plan:   Encounter Diagnoses  Name Primary?   Shortness of breath Yes   Moderate persistent asthma without complication    Essential hypertension, benign    Pitting edema    Type 2 diabetes mellitus with other specified complication, without long-term current use of insulin (HCC)    Morbid obesity (HCC)     Orders Placed This Encounter  Procedures   CT CHEST WO CONTRAST     Assessment and Plan    Asthma  Asthma   - Continue symbicort  and albuterol prn.    - Stable  Abnormal lung sounds/exertional dyspnea with persistent abnormal lung sounds, including crackles, suggesting possible pulmonary fibrosis. Previous COVID-19 infection may have contributed to lung scarring. Chest x-ray was normal, but CT is needed for further evaluation as x-ray is only 60% sensitive for fluid detection.  Normal BNP - Order chest CT to evaluate for pulmonary fibrosis, fluid  Chronic venous insufficiency with lower extremity edema and recurrent cellulitis Chronic venous insufficiency with lower extremity edema and recurrent cellulitis, particularly in the summer. Edema has improved with compression and exercise. No significant benefit from previous diuretic use. No current cellulitis. - Advise continued use of compression wraps - Encourage regular walking to improve circulation - Venous doppler to exclude contributing venous insufficiency  Type 2 diabetes mellitus Type 2 diabetes mellitus with well-managed control. Hemoglobin A1c is 6.9, slightly increased from 6.8. Current medications include metformin and Jardiance. No hypoglycemia reported. Continue diabetic diet. J- Continue  metformin and Jardiance - Advise to maintain diabetic diet and regular exercise - Plan for lab Jan

## 2023-10-19 NOTE — Telephone Encounter (Signed)
 Patient was in office today and dropped off a letter from insurance about her Budesonide  Inhaler. It looks like we need a PA for it to get covered showing that it is a maintenance medication for patient. Please let us  know if you need anything further from us . Thanks!

## 2023-10-21 ENCOUNTER — Other Ambulatory Visit (HOSPITAL_COMMUNITY): Payer: Self-pay

## 2023-10-21 NOTE — Telephone Encounter (Signed)
 Do we have a copy of the letter? I don't see it in pt media, per test claim, medication was filled recently on 10/04/23 and is currently too soon to fill

## 2023-10-25 ENCOUNTER — Ambulatory Visit
Admission: RE | Admit: 2023-10-25 | Discharge: 2023-10-25 | Disposition: A | Discharge status: Home / Self Care | Source: Ambulatory Visit

## 2023-10-25 DIAGNOSIS — R0602 Shortness of breath: Secondary | ICD-10-CM | POA: Insufficient documentation

## 2023-10-26 ENCOUNTER — Telehealth: Payer: Self-pay

## 2023-10-26 ENCOUNTER — Other Ambulatory Visit (HOSPITAL_COMMUNITY): Payer: Self-pay

## 2023-10-26 NOTE — Telephone Encounter (Signed)
 Copied from CRM #[id number]. Topic: Clinical - Medication Prior Auth >> Oct 26, 2023 11:12 AM Larissa RAMAN wrote: Reason for CRM: April with [facility] requesting a Prior authorization for budesonide -formoterol  (SYMBICORT ) 160-4.5 MCG/ACT inhaler.  CALLBACK# [phone]- benefits line Authorization line- [id number]

## 2023-10-26 NOTE — Telephone Encounter (Signed)
 New prior auth submitted. Waiting for questions to populate.

## 2023-10-31 ENCOUNTER — Other Ambulatory Visit: Payer: Self-pay

## 2023-10-31 ENCOUNTER — Telehealth: Payer: Self-pay

## 2023-10-31 DIAGNOSIS — J4540 Moderate persistent asthma, uncomplicated: Secondary | ICD-10-CM

## 2023-10-31 MED ORDER — BUDESONIDE-FORMOTEROL FUMARATE 160-4.5 MCG/ACT IN AERO: 2.0000 | INHALATION_SPRAY | Freq: Two times a day (BID) | RESPIRATORY_TRACT | 12 refills | Status: DC

## 2023-10-31 MED ORDER — BUDESONIDE-FORMOTEROL FUMARATE 160-4.5 MCG/ACT IN AERO: 2.0000 | INHALATION_SPRAY | Freq: Two times a day (BID) | RESPIRATORY_TRACT | 3 refills | Status: AC

## 2023-10-31 NOTE — Telephone Encounter (Signed)
 Copied from CRM #[id number]. Topic: Clinical - Prescription Issue >> Oct 31, 2023  4:23 PM Nathanel BROCKS wrote: Reason for CRM: budesonide -formoterol  (SYMBICORT ) 160-4.5 MCG/ACT inhaler   [facility] [place] called and stated that the prescibed qty is wrong it was sent over as 1 but needs to 10.2. Please advise, [phone]

## 2023-11-02 ENCOUNTER — Other Ambulatory Visit: Payer: Self-pay

## 2023-11-03 MED ORDER — AMLODIPINE BESYLATE 5 MG PO TABS: 5.0000 mg | ORAL_TABLET | Freq: Every day | ORAL | 1 refills | Status: AC

## 2023-11-04 ENCOUNTER — Telehealth

## 2023-11-04 DIAGNOSIS — R0600 Dyspnea, unspecified: Secondary | ICD-10-CM | POA: Diagnosis not present

## 2023-11-04 DIAGNOSIS — I2510 Atherosclerotic heart disease of native coronary artery without angina pectoris: Secondary | ICD-10-CM | POA: Insufficient documentation

## 2023-11-04 MED ORDER — FLUTICASONE PROPIONATE 50 MCG/ACT NA SUSP: 2.0000 | Freq: Every day | NASAL | 6 refills | Status: DC

## 2023-11-04 NOTE — Progress Notes (Signed)
 Progress Note  Physician: Emarie Paul A Analeise Mccleery, MD   HPI: Dana Murillo is a 63 y.o. female presenting on 11/04/2023 for No chief complaint on file. . I connected with  Dana Murillo [place] on 11/04/23 by a video enabled telemedicine application and verified that I am speaking with the correct person    History of Present Illness   Dana Murillo is a 63 year old female with asthma who presents with shortness of breath during exertion.  Dyspnea on exertion - Shortness of breath occurs during exertion, even with mild exertion like showering - Requires sitting down for about five minutes to recover - No use of inhalers during these episodes - No associated wheezing or coughing - Symptoms are alleviated by rest - No chest pain or palpitations  Asthma history and management - Asthma diagnosis - Currently taking Symbicort  - No recent lung infections other than COVID-19 in the last year - Denies any history of pneumonia  Pulmonary imaging findings - Recent CT scan showed scarring of the lungs and a few small nodules -    FINDINGS: Cardiovascular: Heart is normal size. Aorta is normal caliber. Scattered coronary artery and aortic calcifications.   Mediastinum/Nodes: No mediastinal, hilar, or axillary adenopathy. Trachea and esophagus are unremarkable. Thyroid unremarkable.   Lungs/Pleura: Linear densities peripherally in the lungs bilaterally, likely subpleural scarring. No effusions. No confluent airspace opacities. Left lower lobe nodule on image 93 measures 3 mm. Subpleural left lower lobe nodule on image 97 measures 4 mm.   Upper Abdomen: No acute findings   Musculoskeletal: Chest wall soft tissues are unremarkable. No acute bony abnormality.  Metabolic and cardiovascular risk factors for CAD - Diabetes mellitus - Currently taking rosuvastatin   - Morbid obesity   Allergic rhinitis in the mornings ongoing since covid infection.    Medical  history:  Relevant past medical, surgical, family and social history reviewed and updated as indicated. Interim medical history since our last visit reviewed.  Allergies and medications reviewed and updated.   ROS: Negative unless specifically indicated above in HPI.    Current Outpatient Medications:    acetaminophen  (TYLENOL  8 HOUR) 650 MG CR tablet, , Disp: , Rfl:    albuterol (VENTOLIN HFA) 108 (90 Base) MCG/ACT inhaler, Inhale into the lungs every 6 (six) hours as needed for wheezing or shortness of breath., Disp: , Rfl:    amLODipine (NORVASC) 5 MG tablet, Take 1 tablet (5 mg total) by mouth daily., Disp: 90 tablet, Rfl: 1   budesonide -formoterol  (SYMBICORT ) 160-4.5 MCG/ACT inhaler, Inhale 2 puffs into the lungs 2 (two) times daily. Dispense brand name only., Disp: 10.2 g, Rfl: 3   Calcium Carbonate-Vit D-Min (CALCIUM 1200 PO), Take 1 tablet by mouth daily. , Disp: , Rfl:    carvedilol (COREG) 25 MG tablet, Take 25 mg by mouth 2 (two) times daily with a meal. , Disp: , Rfl:    divalproex  (DEPAKOTE ) 250 MG DR tablet, Take 250-500 mg by mouth 3 (three) times daily. Take 250 at breakfast , 500 at lunch, and 500 in the evening, Disp: , Rfl:    empagliflozin (JARDIANCE) 25 MG TABS tablet, Take 25 mg by mouth daily., Disp: , Rfl:    fish oil-omega-3 fatty acids 1000 MG capsule, Take 4 g by mouth daily. , Disp: , Rfl:    glucosamine-chondroitin 500-400 MG tablet, Take 1 tablet by mouth daily. , Disp: , Rfl:    losartan (  COZAAR) 50 MG tablet, Take 50 mg by mouth daily., Disp: , Rfl:    magnesium gluconate (MAGONATE) 500 (27 Mg) MG TABS tablet, Take 480 mg by mouth daily., Disp: , Rfl:    metFORMIN (GLUCOPHAGE) 500 MG tablet, Take 500 mg by mouth daily with breakfast., Disp: , Rfl:    Multiple Vitamins-Minerals (MULTIVITAMIN WITH MINERALS) tablet, Take 1 tablet by mouth daily., Disp: , Rfl:    rosuvastatin (CRESTOR) 20 MG tablet, Take 20 mg by mouth daily., Disp: , Rfl:        Objective:      There were no vitals taken for this visit.  Wt Readings from Last 3 Encounters:  10/19/23 256 lb (116.1 kg)  10/05/23 258 lb 6 oz (117.2 kg)  08/08/20 260 lb (117.9 kg)       Assessment & Plan:   Encounter Diagnoses  Name Primary?   Dyspnea, unspecified type Yes   Coronary artery calcification     No orders of the defined types were placed in this encounter.    Assessment and Plan    Pulmonary fibrosis with exertional dyspnea Probable lung scarring with small nodules on CT. Differential includes pulmonary fibrosis and possible asthma contribution.  - Consider also CAD with dyspnea - Refer to pulmonologist for evaluation and pulmonary function test.  Asthma Intermittent dyspnea without wheezing or coughing. Currently using Symbicort /albuterol.   Coronary artery calcification Coronary artery calcification on CT suggests potential plaque presence. Increased risk due to type 2 diabetes mellitus, obesity, hypertension  - Order coronary calcium scoring for further risk stratification.   If elevated, will need further imaging.    Allergic rhinitis Allergic rhinitis. - Prescribe Flonase (fluticasone) nasal spray. Use two sprays in each nostril in the morning after blowing nose.

## 2023-11-10 ENCOUNTER — Ambulatory Visit: Payer: Self-pay

## 2023-11-10 ENCOUNTER — Ambulatory Visit
Admission: RE | Admit: 2023-11-10 | Discharge: 2023-11-10 | Disposition: A | Discharge status: Home / Self Care | Payer: Self-pay | Source: Ambulatory Visit

## 2023-11-10 DIAGNOSIS — I2510 Atherosclerotic heart disease of native coronary artery without angina pectoris: Secondary | ICD-10-CM | POA: Insufficient documentation

## 2023-11-10 DIAGNOSIS — R0600 Dyspnea, unspecified: Secondary | ICD-10-CM | POA: Insufficient documentation

## 2023-11-16 ENCOUNTER — Ambulatory Visit: Admitting: Pulmonary Disease

## 2023-11-16 ENCOUNTER — Encounter: Payer: Self-pay | Admitting: Pulmonary Disease

## 2023-11-16 VITALS — BP 128/78 | HR 66 | Temp 97.6°F | Ht 62.0 in | Wt 255.2 lb

## 2023-11-16 DIAGNOSIS — J45909 Unspecified asthma, uncomplicated: Secondary | ICD-10-CM

## 2023-11-16 DIAGNOSIS — R0602 Shortness of breath: Secondary | ICD-10-CM

## 2023-11-16 DIAGNOSIS — R0609 Other forms of dyspnea: Secondary | ICD-10-CM | POA: Diagnosis not present

## 2023-11-16 NOTE — Progress Notes (Signed)
 Synopsis: Referred in by Zafirov, Clarissa A, MD   Subjective:   PATIENT ID: Dana Murillo GENDER: female DOB: 30-Aug-1960, MRN: [id number]  Chief Complaint  Patient presents with   Shortness of Breath    DOE for years. Has been told she has asthma. No wheezing or cough. Using Albuterol PRN and Symbicort  BID. Has been on Symbicort  for a month and feels like it is helping somewhat.    HPI Discussed the use of AI scribe software for clinical note transcription with the patient, who gave verbal consent to proceed.  History of Present Illness   Dana Murillo is a 63 year old female with asthma who presents with shortness of breath.  She has experienced shortness of breath for several years, initially attributing it to being overweight. Approximately four years ago, she was diagnosed with asthma and was initially prescribed a Breo inhaler and albuterol. She has since switched from Breo to Symbicort , taking two puffs twice a day, and uses albuterol as needed. Her symptoms include shortness of breath on exertion, such as walking long distances or performing strenuous activities. No wheezing or chest tightness. Cold weather improves her symptoms, while heat and humidity exacerbate them. She occasionally experiences a cough at night, which has improved since starting inhaler therapy.  She has had COVID-19 three times, with the last occurrence in December of the previous year. Since having COVID-19, she has experienced a continuous runny nose and runny eyes, particularly in the morning. No seasonal allergies, itchy skin rash, or eczema.  Her family history includes emphysema on her mother's side, though she is unsure if it was related to smoking. She has never smoked and does not consume alcohol. She is a retired Runner, broadcasting/film/video and lives with her husband, who is not a smoker. 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  She has not been tested for sleep apnea and does not snore  according to her husband, though she wakes up feeling sleepy. She is currently on a statin, specifically rosuvastatin, but not on aspirin.        Family History  Problem Relation Age of Onset   Diabetes Mother        97 yrs   Dementia Mother    Arthritis Mother    Asthma Mother    Hypertension Mother    Obesity Mother    Heart disease Father        58 yrs   Diabetes Father    Colon polyps Father    Hyperlipidemia Father    Hypertension Father    Obesity Father      Social History   Socioeconomic History   Marital status: Married    Spouse name: Not on file   Number of children: Not on file   Years of education: Not on file   Highest education level: Bachelor's degree (e.g., BA, AB, BS)  Occupational History   Not on file  Tobacco Use   Smoking status: Never   Smokeless tobacco: Never  Vaping Use   Vaping status: Never Used  Substance and Sexual Activity   Alcohol use: Never   Drug use: Never   Sexual activity: Not on file  Other Topics Concern   Not on file  Social History Narrative   Not on file   Social Drivers of Health   Financial Resource Strain: Low Risk  (10/04/2023)   Overall Financial Resource Strain (CARDIA)    Difficulty of Paying Living Expenses: Not hard  at all  Food Insecurity: No Food Insecurity (10/04/2023)   Hunger Vital Sign    Worried About Running Out of Food in the Last Year: Never true    Ran Out of Food in the Last Year: Never true  Transportation Needs: No Transportation Needs (10/04/2023)   PRAPARE - Administrator, Civil Service (Medical): No    Lack of Transportation (Non-Medical): No  Physical Activity: Insufficiently Active (10/04/2023)   Exercise Vital Sign    Days of Exercise per Week: 2 days    Minutes of Exercise per Session: 10 min  Stress: No Stress Concern Present (10/04/2023)   [employer] of Occupational Health - Occupational Stress Questionnaire    Feeling of Stress: Only a little  Social Connections:  Socially Integrated (10/04/2023)   Social Connection and Isolation Panel    Frequency of Communication with Friends and Family: More than three times a week    Frequency of Social Gatherings with Friends and Family: Three times a week    Attends Religious Services: More than 4 times per year    Active Member of Clubs or Organizations: Yes    Attends Banker Meetings: More than 4 times per year    Marital Status: Married  Catering manager Violence: Not on file        Objective:   Vitals:   11/16/23 1042  BP: 128/78  Pulse: 66  Temp: 97.6 F (36.4 C)  SpO2: 96%  Weight: 255 lb 3.2 oz (115.8 kg)  Height: 5' 2 (1.575 m)   96% on RA BMI Readings from Last 3 Encounters:  11/16/23 46.68 kg/m  10/19/23 46.82 kg/m  10/05/23 47.26 kg/m   Wt Readings from Last 3 Encounters:  11/16/23 255 lb 3.2 oz (115.8 kg)  10/19/23 256 lb (116.1 kg)  10/05/23 258 lb 6 oz (117.2 kg)    Physical Exam GEN: NAD, Healthy Appearing HEENT: Supple Neck, Reactive Pupils, EOMI  CVS: Normal S1, Normal S2, RRR, No murmurs or ES appreciated  Lungs: Clear bilateral air entry.  Abdomen: Soft, non tender, non distended, + BS  Extremities: Warm and well perfused, No edema    Ancillary Information   CBC    Component Value Date/Time   WBC 6.9 10/10/2023 0938   RBC 5.28 (H) 10/10/2023 0938   HGB 15.1 10/10/2023 0938   HGB 11.7 (L) 07/26/2012 0508   HCT 47.6 (H) 10/10/2023 0938   HCT 37.4 07/25/2012 0522   PLT 199 10/10/2023 0938   PLT 217 07/25/2012 0522   MCV 90.2 10/10/2023 0938   MCV 85 07/25/2012 0522   MCH 28.6 10/10/2023 0938   MCHC 31.7 (L) 10/10/2023 0938   RDW 15.4 (H) 10/10/2023 0938   RDW 16.5 (H) 07/25/2012 0522   LYMPHSABS 1.3 07/25/2012 0522   MONOABS 0.9 07/25/2012 0522   EOSABS 221 10/10/2023 0938   EOSABS 0.0 07/25/2012 0522   BASOSABS 48 10/10/2023 0938   BASOSABS 0.0 07/25/2012 0522       No data to display           Assessment & Plan:     #Asthma with exertional dyspnea Asthma with exertional dyspnea, managed with Symbicort . Symptoms exacerbated by heat and humidity, improved with cold weather. Differential includes post-COVID reactive airway disease. - Continue Symbicort  160 mcg, two puffs twice daily. - Use albuterol as needed, two puffs every six hours. - Order pulmonary function test to assess lung function and volume. - Order echocardiogram to evaluate heart function  and pressures.  #Allergic rhinitis Persistent runny nose and watery eyes, non-seasonal, post-COVID-19.     Return in about 4 months (around 03/18/2024).  I personally spent a total of 60 minutes in the care of the patient today including preparing to see the patient, getting/reviewing separately obtained history, performing a medically appropriate exam/evaluation, counseling and educating, placing orders, documenting clinical information in the EHR, independently interpreting results, and communicating results.   Darrin Barn, MD [facility] 11/16/2023 11:09 AM

## 2023-11-21 ENCOUNTER — Telehealth

## 2023-11-21 DIAGNOSIS — J4540 Moderate persistent asthma, uncomplicated: Secondary | ICD-10-CM | POA: Diagnosis not present

## 2023-11-21 DIAGNOSIS — I10 Essential (primary) hypertension: Secondary | ICD-10-CM

## 2023-11-21 DIAGNOSIS — R0600 Dyspnea, unspecified: Secondary | ICD-10-CM

## 2023-11-21 DIAGNOSIS — R609 Edema, unspecified: Secondary | ICD-10-CM

## 2023-11-21 DIAGNOSIS — I2510 Atherosclerotic heart disease of native coronary artery without angina pectoris: Secondary | ICD-10-CM | POA: Insufficient documentation

## 2023-11-21 DIAGNOSIS — Z8711 Personal history of peptic ulcer disease: Secondary | ICD-10-CM | POA: Insufficient documentation

## 2023-11-21 NOTE — Progress Notes (Signed)
 Progress Note  Physician: Zivah Mayr A Evett Kassa, MD   Patient contacted 11/21/23 at  2:00 PM EDT by a video enabled telemedicine application and verified that I am speaking with the correct person.   Patient is aware of limitations of evaluation by telemedicine The patient expressed understanding and agreed to proceed.   HPI: Dana Murillo is a 63 y.o. female presenting on 11/21/2023 for No chief complaint on file. . Patient with ongoing shortness of breath in the setting of asthma.  She had seen pulmonology and a repeat pulmonary function test as well as cardiac echo has been ordered.  She remains on Symbicort  and albuterol for as needed use  Patient with recent calcium scoring which shows moderate (143) plaque deposits in the right coronary artery.  Left anterior descending and other arteries showing minimal to no plaque. She remains on rosuvastatin in the setting of diabetes.  Patient has been working on increasing physical activity overall which has been a little bit helpful.   Social history:  Relevant past medical, surgical, family and social history reviewed and updated as indicated. Interim medical history since our last visit reviewed.  Allergies and medications reviewed and updated.  DATA REVIEWED: CHART IN EPIC  ROS: Negative unless specifically indicated above in HPI.    Current Outpatient Medications:    acetaminophen  (TYLENOL  8 HOUR) 650 MG CR tablet, , Disp: , Rfl:    albuterol (VENTOLIN HFA) 108 (90 Base) MCG/ACT inhaler, Inhale into the lungs every 6 (six) hours as needed for wheezing or shortness of breath., Disp: , Rfl:    amLODipine (NORVASC) 5 MG tablet, Take 1 tablet (5 mg total) by mouth daily., Disp: 90 tablet, Rfl: 1   budesonide -formoterol  (SYMBICORT ) 160-4.5 MCG/ACT inhaler, Inhale 2 puffs into the lungs 2 (two) times daily. Dispense brand name only., Disp: 10.2 g, Rfl: 3   Calcium Carbonate-Vit D-Min (CALCIUM 1200 PO), Take 1 tablet by mouth  daily. , Disp: , Rfl:    carvedilol (COREG) 25 MG tablet, Take 25 mg by mouth 2 (two) times daily with a meal. , Disp: , Rfl:    divalproex  (DEPAKOTE ) 250 MG DR tablet, Take 250-500 mg by mouth 3 (three) times daily. Take 250 at breakfast , 500 at lunch, and 500 in the evening, Disp: , Rfl:    empagliflozin (JARDIANCE) 25 MG TABS tablet, Take 25 mg by mouth daily., Disp: , Rfl:    fish oil-omega-3 fatty acids 1000 MG capsule, Take 4 g by mouth daily. , Disp: , Rfl:    fluticasone (FLONASE) 50 MCG/ACT nasal spray, Place 2 sprays into both nostrils daily., Disp: 16 g, Rfl: 6   glucosamine-chondroitin 500-400 MG tablet, Take 1 tablet by mouth daily. , Disp: , Rfl:    losartan (COZAAR) 50 MG tablet, Take 50 mg by mouth daily., Disp: , Rfl:    magnesium gluconate (MAGONATE) 500 (27 Mg) MG TABS tablet, Take 480 mg by mouth daily., Disp: , Rfl:    metFORMIN (GLUCOPHAGE) 500 MG tablet, Take 500 mg by mouth daily with breakfast., Disp: , Rfl:    Multiple Vitamins-Minerals (MULTIVITAMIN WITH MINERALS) tablet, Take 1 tablet by mouth daily., Disp: , Rfl:    rosuvastatin (CRESTOR) 20 MG tablet, Take 20 mg by mouth daily., Disp: , Rfl:       Objective:  Telephone visit     Assessment & Plan:  Coronary artery disease involving native heart without angina pectoris, unspecified vessel  or lesion type  Dyspnea, unspecified type  Moderate persistent asthma without complication     Coronary artery disease-patient to continue with rosuvastatin 20 mg daily We discussed using low-dose aspirin but patient does have a history of gastric ulceration simply with Voltaren use.  She already has a rather lengthy medicine regime and so we will defer aspirin for now.  She will need ongoing monitoring.  2.  Dyspnea/Asthma  Continue with albuterol and Symbicort  for asthma control.  This seems to be generally stable.  Will await the results of pulmonary function testing and cardiac echo.  Imaging showing some nodularities  on CT but these are thought to be benign she is relatively low risk otherwise.  Patient is morbidly obese which is likely not helpful in terms of cardiac and pulmonary workload. Continue with physical activity -she has been doing well there.  Will f/u with pt post ECHO

## 2023-11-23 DIAGNOSIS — Z1231 Encounter for screening mammogram for malignant neoplasm of breast: Secondary | ICD-10-CM | POA: Diagnosis not present

## 2023-12-02 ENCOUNTER — Other Ambulatory Visit: Payer: Self-pay

## 2023-12-02 MED ORDER — LOSARTAN POTASSIUM 50 MG PO TABS: 50.0000 mg | ORAL_TABLET | Freq: Every day | ORAL | 0 refills | Status: DC

## 2023-12-06 ENCOUNTER — Ambulatory Visit: Admitting: Pulmonary Disease

## 2023-12-12 ENCOUNTER — Ambulatory Visit
Admission: RE | Admit: 2023-12-12 | Discharge: 2023-12-12 | Disposition: A | Discharge status: Home / Self Care | Source: Ambulatory Visit | Attending: Pulmonary Disease | Admitting: Pulmonary Disease

## 2023-12-12 DIAGNOSIS — R0602 Shortness of breath: Secondary | ICD-10-CM | POA: Diagnosis not present

## 2023-12-12 DIAGNOSIS — E119 Type 2 diabetes mellitus without complications: Secondary | ICD-10-CM | POA: Insufficient documentation

## 2023-12-12 DIAGNOSIS — I10 Essential (primary) hypertension: Secondary | ICD-10-CM | POA: Insufficient documentation

## 2023-12-12 LAB — ECHOCARDIOGRAM COMPLETE
AR max vel: 1.97 cm2
AV Area VTI: 2.54 cm2
AV Area mean vel: 1.92 cm2
AV Mean grad: 2.5 mmHg
AV Peak grad: 5.6 mmHg
Ao pk vel: 1.18 m/s
Area-P 1/2: 3.11 cm2
MV VTI: 2.23 cm2
S' Lateral: 2.58 cm

## 2023-12-12 NOTE — Progress Notes (Signed)
*  PRELIMINARY RESULTS* Echocardiogram 2D Echocardiogram has been performed.  Floydene Harder 12/12/2023, 11:46 AM

## 2023-12-13 ENCOUNTER — Other Ambulatory Visit: Payer: Self-pay

## 2023-12-13 ENCOUNTER — Encounter: Payer: Self-pay | Admitting: Pulmonary Disease

## 2023-12-14 ENCOUNTER — Encounter: Payer: Self-pay | Admitting: Pulmonary Disease

## 2023-12-14 ENCOUNTER — Ambulatory Visit

## 2023-12-14 ENCOUNTER — Ambulatory Visit: Admitting: Pulmonary Disease

## 2023-12-14 VITALS — BP 136/86 | HR 60 | Temp 98.7°F | Ht 62.0 in | Wt 255.0 lb

## 2023-12-14 DIAGNOSIS — R0602 Shortness of breath: Secondary | ICD-10-CM

## 2023-12-14 DIAGNOSIS — J45909 Unspecified asthma, uncomplicated: Secondary | ICD-10-CM

## 2023-12-14 LAB — PULMONARY FUNCTION TEST
DL/VA % pred: 117 %
DL/VA: 4.99 ml/min/mmHg/L
DLCO unc % pred: 91 %
DLCO unc: 16.7 ml/min/mmHg
FEF 25-75 Post: 1.34 L/s
FEF 25-75 Pre: 1.89 L/s
FEF2575-%Change-Post: -29 %
FEF2575-%Pred-Post: 66 %
FEF2575-%Pred-Pre: 93 %
FEV1-%Change-Post: -7 %
FEV1-%Pred-Post: 71 %
FEV1-%Pred-Pre: 77 %
FEV1-Post: 1.6 L
FEV1-Pre: 1.73 L
FEV1FVC-%Change-Post: 0 %
FEV1FVC-%Pred-Pre: 107 %
FEV6-%Change-Post: -7 %
FEV6-%Pred-Post: 68 %
FEV6-%Pred-Pre: 74 %
FEV6-Post: 1.93 L
FEV6-Pre: 2.09 L
FEV6FVC-%Pred-Post: 104 %
FEV6FVC-%Pred-Pre: 104 %
FVC-%Change-Post: -7 %
FVC-%Pred-Post: 66 %
FVC-%Pred-Pre: 71 %
FVC-Post: 1.93 L
FVC-Pre: 2.09 L
Post FEV1/FVC ratio: 83 %
Post FEV6/FVC ratio: 100 %
Pre FEV1/FVC ratio: 83 %
Pre FEV6/FVC Ratio: 100 %
RV % pred: 65 %
RV: 1.29 L
TLC % pred: 82 %
TLC: 3.93 L

## 2023-12-14 NOTE — Patient Instructions (Signed)
 Full PFT completed today ? ?

## 2023-12-14 NOTE — Progress Notes (Signed)
 Full PFT completed today ? ?

## 2023-12-14 NOTE — Progress Notes (Signed)
 Synopsis: Referred in by Malka Domino, MD   Subjective:   PATIENT ID: Dana Murillo GENDER: female DOB: Apr 19, 1960, MRN: [id number]  Chief Complaint  Patient presents with   Shortness of Breath    DOE. No wheezing or cough. PFT today. Symbicort - BID helps with her breathing. Albuterol- PRN    HPI Discussed the use of AI scribe software for clinical note transcription with the patient, who gave verbal consent to proceed.  History of Present Illness   Dana Murillo is a 63 year old female with asthma who presents with shortness of breath.  She has experienced shortness of breath for several years, initially attributing it to being overweight. Approximately four years ago, she was diagnosed with asthma and was initially prescribed a Breo inhaler and albuterol. She has since switched from Breo to Symbicort , taking two puffs twice a day, and uses albuterol as needed. Her symptoms include shortness of breath on exertion, such as walking long distances or performing strenuous activities. No wheezing or chest tightness. Cold weather improves her symptoms, while heat and humidity exacerbate them. She occasionally experiences a cough at night, which has improved since starting inhaler therapy.  She has had COVID-19 three times, with the last occurrence in December of the previous year. Since having COVID-19, she has experienced a continuous runny nose and runny eyes, particularly in the morning. No seasonal allergies, itchy skin rash, or eczema.  Her family history includes emphysema on her mother's side, though she is unsure if it was related to smoking. She has never smoked and does not consume alcohol. She is a retired runner, broadcasting/film/video and lives with her husband, who is not a smoker. 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  She has not been tested for sleep apnea and does not snore according to her husband, though she wakes up feeling sleepy. She is currently on a  statin, specifically rosuvastatin, but not on aspirin.     OV 12/14/2023 - Dana Murillo is here to follow up regarding her shortness of breath. She underwent PFTs that did not show any obstructive defect. It showed mild restriction with normal DLCO that is usually seen in neuromuscular disease or body habitus. Her echocardiogram is normal with normal LVEF. We discussed that the most likely reason behind her shortness of breath is deconditioning and being overweight. We discussed weight loss strategies and advised her to discuss Ozempic with her pcp.     Family History  Problem Relation Age of Onset   Diabetes Mother        75 yrs   Dementia Mother    Arthritis Mother    Asthma Mother    Hypertension Mother    Obesity Mother    Heart disease Father        57 yrs   Diabetes Father    Colon polyps Father    Hyperlipidemia Father    Hypertension Father    Obesity Father      Social History   Socioeconomic History   Marital status: Married    Spouse name: Not on file   Number of children: Not on file   Years of education: Not on file   Highest education level: Bachelor's degree (e.g., BA, AB, BS)  Occupational History   Not on file  Tobacco Use   Smoking status: Never   Smokeless tobacco: Never  Vaping Use   Vaping status: Never Used  Substance and Sexual Activity   Alcohol use: Never  Drug use: Never   Sexual activity: Not on file  Other Topics Concern   Not on file  Social History Narrative   Not on file   Social Drivers of Health   Financial Resource Strain: Low Risk  (11/17/2023)   Overall Financial Resource Strain (CARDIA)    Difficulty of Paying Living Expenses: Not hard at all  Food Insecurity: No Food Insecurity (11/17/2023)   Hunger Vital Sign    Worried About Running Out of Food in the Last Year: Never true    Ran Out of Food in the Last Year: Never true  Transportation Needs: No Transportation Needs (11/17/2023)   PRAPARE - Scientist, Research (physical Sciences) (Medical): No    Lack of Transportation (Non-Medical): No  Physical Activity: Sufficiently Active (11/17/2023)   Exercise Vital Sign    Days of Exercise per Week: 3 days    Minutes of Exercise per Session: 50 min  Recent Concern: Physical Activity - Insufficiently Active (10/04/2023)   Exercise Vital Sign    Days of Exercise per Week: 2 days    Minutes of Exercise per Session: 10 min  Stress: No Stress Concern Present (11/17/2023)   [employer] of Occupational Health - Occupational Stress Questionnaire    Feeling of Stress: Only a little  Social Connections: Socially Integrated (11/17/2023)   Social Connection and Isolation Panel    Frequency of Communication with Friends and Family: More than three times a week    Frequency of Social Gatherings with Friends and Family: More than three times a week    Attends Religious Services: More than 4 times per year    Active Member of [employer] or Organizations: Yes    Attends Engineer, Structural: More than 4 times per year    Marital Status: Married  Catering Manager Violence: Not on file        Objective:   There were no vitals filed for this visit.    on RA BMI Readings from Last 3 Encounters:  12/14/23 46.75 kg/m  11/16/23 46.68 kg/m  10/19/23 46.82 kg/m   Wt Readings from Last 3 Encounters:  12/14/23 255 lb 9.6 oz (115.9 kg)  11/16/23 255 lb 3.2 oz (115.8 kg)  10/19/23 256 lb (116.1 kg)    Physical Exam GEN: NAD, Morbidly obese HEENT: Supple Neck, Reactive Pupils, EOMI  CVS: Normal S1, Normal S2, RRR, No murmurs or ES appreciated  Lungs: Clear bilateral air entry.  Abdomen: Soft, non tender, non distended, + BS  Extremities: Warm and well perfused, No edema   Labs and imaging were reviewed.  Ancillary Information   CBC    Component Value Date/Time   WBC 6.9 10/10/2023 0938   RBC 5.28 (H) 10/10/2023 0938   HGB 15.1 10/10/2023 0938   HGB 11.7 (L) 07/26/2012 0508   HCT 47.6 (H)  10/10/2023 0938   HCT 37.4 07/25/2012 0522   PLT 199 10/10/2023 0938   PLT 217 07/25/2012 0522   MCV 90.2 10/10/2023 0938   MCV 85 07/25/2012 0522   MCH 28.6 10/10/2023 0938   MCHC 31.7 (L) 10/10/2023 0938   RDW 15.4 (H) 10/10/2023 0938   RDW 16.5 (H) 07/25/2012 0522   LYMPHSABS 1.3 07/25/2012 0522   MONOABS 0.9 07/25/2012 0522   EOSABS 221 10/10/2023 0938   EOSABS 0.0 07/25/2012 0522   BASOSABS 48 10/10/2023 0938   BASOSABS 0.0 07/25/2012 0522      Latest Ref Rng & Units 12/14/2023    9:40  AM  PFT Results  FVC-Pre L 2.09  P  FVC-Predicted Pre % 71  P  FVC-Post L 1.93  P  FVC-Predicted Post % 66  P  Pre FEV1/FVC % % 83  P  Post FEV1/FCV % % 83  P  FEV1-Pre L 1.73  P  FEV1-Predicted Pre % 77  P  FEV1-Post L 1.60  P  DLCO uncorrected ml/min/mmHg 16.70  P  DLCO UNC% % 91  P  DLVA Predicted % 117  P  TLC L 3.93  P  TLC % Predicted % 82  P  RV % Predicted % 65  P    P Preliminary result     Assessment & Plan:   #Dyspnea PFTs have been reviewed and show mild restriction that is in line with central obesity. Echocardiogram is normal with normal LVEF. We discussed weight loss strategies and advised her to consider ozempic or wygovy with her primary care provided.   #Asthma  Asthma with exertional dyspnea, managed with Symbicort . Symptoms exacerbated by heat and humidity, improved with cold weather. Differential includes post-COVID reactive airway disease. - Continue Symbicort  160 mcg, two puffs twice daily. - Use albuterol as needed, two puffs every six hours.  #Allergic rhinitis Persistent runny nose and watery eyes, non-seasonal, post-COVID-19.     RTC 12 months.   I personally spent a total of 30 minutes in the care of the patient today including preparing to see the patient, getting/reviewing separately obtained history, performing a medically appropriate exam/evaluation, counseling and educating, documenting clinical information in the EHR, independently  interpreting results, and communicating results.   Darrin Barn, MD [facility] 12/14/2023 4:40 PM

## 2023-12-21 ENCOUNTER — Other Ambulatory Visit: Payer: Self-pay

## 2023-12-22 ENCOUNTER — Ambulatory Visit

## 2024-01-02 ENCOUNTER — Other Ambulatory Visit: Payer: Self-pay

## 2024-01-02 ENCOUNTER — Telehealth

## 2024-01-02 MED ORDER — METFORMIN HCL 500 MG PO TABS: 500.0000 mg | ORAL_TABLET | Freq: Every day | ORAL | 0 refills | Status: AC

## 2024-01-02 MED ORDER — OZEMPIC (0.25 OR 0.5 MG/DOSE) 2 MG/3ML ~~LOC~~ SOPN: 0.2500 mg | PEN_INJECTOR | SUBCUTANEOUS | 1 refills | Status: AC

## 2024-01-02 NOTE — Progress Notes (Signed)
 Progress Note  Physician: Kalimah Capurro A Eulis Salazar, MD   HPI: Dana Murillo is a 63 y.o. female presenting on 01/02/2024 for No chief complaint on file. .  Discussed the use of AI scribe software for clinical note transcription with the patient, who gave verbal consent to proceed.  History of Present Illness   Dana Murillo is a 63 year old female with diabetes and obesity who presents for f/u  Body weight and morbid obesity - Weight remains stable at 255 pounds since October - Longstanding difficulty with weight management - Weight gain attributed in part to Depakote  use and age  Dyspnea We have had increasing exertional SOB.   Pulmonology eval indicating restrictive defect due to obesity.  Normal EF on ECHO.    Glycemic control - Last hemoglobin A1c was 6.9 - Currently taking metformin 500 mg and Jardiance - No significant side effects from current diabetes medications - No history of pancreatitis, gallstones, hypertriglyceridemia, or alcohol use         Medical history:  Relevant past medical, surgical, family and social history reviewed and updated as indicated. Interim medical history since our last visit reviewed.  Allergies and medications reviewed and updated.   ROS: Negative unless specifically indicated above in HPI.    Current Outpatient Medications:    Semaglutide,0.25 or 0.5MG /DOS, (OZEMPIC, 0.25 OR 0.5 MG/DOSE,) 2 MG/3ML SOPN, Inject 0.25 mg into the skin once a week., Disp: 3 mL, Rfl: 1   acetaminophen  (TYLENOL  8 HOUR) 650 MG CR tablet, , Disp: , Rfl:    albuterol (VENTOLIN HFA) 108 (90 Base) MCG/ACT inhaler, Inhale into the lungs every 6 (six) hours as needed for wheezing or shortness of breath., Disp: , Rfl:    amLODipine  (NORVASC ) 5 MG tablet, Take 1 tablet (5 mg total) by mouth daily., Disp: 90 tablet, Rfl: 1   budesonide -formoterol  (SYMBICORT ) 160-4.5 MCG/ACT inhaler, Inhale 2 puffs into the lungs 2 (two) times daily. Dispense brand  name only., Disp: 10.2 g, Rfl: 3   Calcium Carbonate-Vit D-Min (CALCIUM 1200 PO), Take 1 tablet by mouth daily. , Disp: , Rfl:    carvedilol (COREG) 25 MG tablet, Take 25 mg by mouth 2 (two) times daily with a meal. , Disp: , Rfl:    divalproex  (DEPAKOTE ) 250 MG DR tablet, Take 250-500 mg by mouth 3 (three) times daily. Take 250 at breakfast , 500 at lunch, and 500 in the evening, Disp: , Rfl:    empagliflozin (JARDIANCE) 25 MG TABS tablet, Take 25 mg by mouth daily., Disp: , Rfl:    fish oil-omega-3 fatty acids 1000 MG capsule, Take 4 g by mouth daily. , Disp: , Rfl:    fluticasone  (FLONASE ) 50 MCG/ACT nasal spray, Place 2 sprays into both nostrils daily., Disp: 16 g, Rfl: 6   glucosamine-chondroitin 500-400 MG tablet, Take 1 tablet by mouth daily. , Disp: , Rfl:    losartan  (COZAAR ) 50 MG tablet, Take 1 tablet (50 mg total) by mouth daily., Disp: 90 tablet, Rfl: 0   magnesium gluconate (MAGONATE) 500 (27 Mg) MG TABS tablet, Take 480 mg by mouth daily., Disp: , Rfl:    metFORMIN (GLUCOPHAGE) 500 MG tablet, Take 1 tablet (500 mg total) by mouth daily with breakfast., Disp: 90 tablet, Rfl: 0   Multiple Vitamins-Minerals (MULTIVITAMIN WITH MINERALS) tablet, Take 1 tablet by mouth daily., Disp: , Rfl:    rosuvastatin (CRESTOR) 20 MG tablet, Take 20 mg by  mouth daily., Disp: , Rfl:        Objective:     There were no vitals taken for this visit.  Wt Readings from Last 3 Encounters:  12/14/23 255 lb (115.7 kg)  12/14/23 255 lb 9.6 oz (115.9 kg)  11/16/23 255 lb 3.2 oz (115.8 kg)    Physical Exam  Physical Exam Vitals reviewed.  Constitutional:      Appearance: Normal appearance. Well-developed with normal weight.  Cardiovascular:     Rate and Rhythm: Normal rate and regular rhythm. Normal heart sounds. Normal peripheral pulses Pulmonary:     Normal breath sounds with normal effort Skin:    General: Skin is warm and dry without noticeable rash. Neurological:     General: No focal  deficit present.  Psychiatric:        Mood and Affect: Mood, behavior and cognition normal      Assessment & Plan:  No diagnosis found.  No orders of the defined types were placed in this encounter.    Assessment and Plan    Type 2 diabetes mellitus and obesity Discussed Ozempic for weight loss and diabetes management. Explained side effects and need for medication adjustment to prevent hypoglycemia. Emphasized resistance exercises to maintain muscle mass. Insurance likely covers Ozempic for both conditions. - Initiated Ozempic 0.25 mg weekly. - Instructed to hold/d/c Jardiance once ozempic started - Continue metformin. - Monitor for side effects: stomach pain, nausea, vomiting. - Encouraged resistance exercises. - Scheduled follow-up in January to assess Ozempic. - Ordered labs in January for diabetes control.      Dyspnea - PFTs have been reviewed and show mild restriction that is in line with central obesity. Echocardiogram is normal with normal LVEF.  Should see improvement with weight loss   F/u in Jan with labs

## 2024-01-05 ENCOUNTER — Other Ambulatory Visit: Payer: Self-pay

## 2024-01-06 ENCOUNTER — Other Ambulatory Visit: Payer: Self-pay

## 2024-01-06 MED ORDER — CARVEDILOL 25 MG PO TABS: 25.0000 mg | ORAL_TABLET | Freq: Two times a day (BID) | ORAL | 0 refills | Status: AC

## 2024-01-11 NOTE — Addendum Note (Signed)
 Addended by: Dustyn Armbrister A on: 01/11/2024 12:48 PM   Modules accepted: Level of Service

## 2024-01-31 ENCOUNTER — Other Ambulatory Visit: Payer: Self-pay

## 2024-01-31 DIAGNOSIS — Z5181 Encounter for therapeutic drug level monitoring: Secondary | ICD-10-CM

## 2024-02-03 ENCOUNTER — Other Ambulatory Visit

## 2024-02-03 DIAGNOSIS — I10 Essential (primary) hypertension: Secondary | ICD-10-CM

## 2024-02-03 DIAGNOSIS — Z5181 Encounter for therapeutic drug level monitoring: Secondary | ICD-10-CM

## 2024-02-03 DIAGNOSIS — J4540 Moderate persistent asthma, uncomplicated: Secondary | ICD-10-CM

## 2024-02-03 DIAGNOSIS — E1169 Type 2 diabetes mellitus with other specified complication: Secondary | ICD-10-CM

## 2024-02-03 DIAGNOSIS — R0602 Shortness of breath: Secondary | ICD-10-CM

## 2024-02-03 DIAGNOSIS — R609 Edema, unspecified: Secondary | ICD-10-CM

## 2024-02-04 LAB — COMPREHENSIVE METABOLIC PANEL WITH GFR
AG Ratio: 1.4 (calc) (ref 1.0–2.5)
ALT: 11 U/L (ref 6–29)
AST: 11 U/L (ref 10–35)
Albumin: 3.7 g/dL (ref 3.6–5.1)
Alkaline phosphatase (APISO): 46 U/L (ref 37–153)
BUN: 20 mg/dL (ref 7–25)
CO2: 30 mmol/L (ref 20–32)
Calcium: 9.1 mg/dL (ref 8.6–10.4)
Chloride: 100 mmol/L (ref 98–110)
Creat: 0.66 mg/dL (ref 0.50–1.05)
Globulin: 2.6 g/dL (ref 1.9–3.7)
Glucose, Bld: 107 mg/dL — ABNORMAL HIGH (ref 65–99)
Potassium: 4.4 mmol/L (ref 3.5–5.3)
Sodium: 139 mmol/L (ref 135–146)
Total Bilirubin: 0.4 mg/dL (ref 0.2–1.2)
Total Protein: 6.3 g/dL (ref 6.1–8.1)
eGFR: 97 mL/min/1.73m2

## 2024-02-04 LAB — CBC WITH DIFFERENTIAL/PLATELET
Absolute Lymphocytes: 2414 {cells}/uL (ref 850–3900)
Absolute Monocytes: 646 {cells}/uL (ref 200–950)
Basophils Absolute: 50 {cells}/uL (ref 0–200)
Basophils Relative: 0.7 %
Eosinophils Absolute: 192 {cells}/uL (ref 15–500)
Eosinophils Relative: 2.7 %
HCT: 46.1 % — ABNORMAL HIGH (ref 35.9–46.0)
Hemoglobin: 14.8 g/dL (ref 11.7–15.5)
MCH: 28.8 pg (ref 27.0–33.0)
MCHC: 32.1 g/dL (ref 31.6–35.4)
MCV: 89.7 fL (ref 81.4–101.7)
MPV: 10 fL (ref 7.5–12.5)
Monocytes Relative: 9.1 %
Neutro Abs: 3799 {cells}/uL (ref 1500–7800)
Neutrophils Relative %: 53.5 %
Platelets: 192 Thousand/uL (ref 140–400)
RBC: 5.14 Million/uL — ABNORMAL HIGH (ref 3.80–5.10)
RDW: 15.3 % — ABNORMAL HIGH (ref 11.0–15.0)
Total Lymphocyte: 34 %
WBC: 7.1 Thousand/uL (ref 3.8–10.8)

## 2024-02-04 LAB — MICROALBUMIN / CREATININE URINE RATIO
Creatinine, Urine: 87 mg/dL (ref 20–275)
Microalb Creat Ratio: 146 mg/g{creat} — ABNORMAL HIGH
Microalb, Ur: 12.7 mg/dL

## 2024-02-04 LAB — HEMOGLOBIN A1C
Hgb A1c MFr Bld: 6.7 % — ABNORMAL HIGH
Mean Plasma Glucose: 146 mg/dL
eAG (mmol/L): 8.1 mmol/L

## 2024-02-04 LAB — VALPROIC ACID LEVEL: Valproic Acid Lvl: 62.7 mg/L (ref 50.0–100.0)

## 2024-02-10 ENCOUNTER — Other Ambulatory Visit: Payer: Self-pay

## 2024-02-10 ENCOUNTER — Telehealth

## 2024-02-10 DIAGNOSIS — E1121 Type 2 diabetes mellitus with diabetic nephropathy: Secondary | ICD-10-CM | POA: Diagnosis not present

## 2024-02-10 DIAGNOSIS — E1169 Type 2 diabetes mellitus with other specified complication: Secondary | ICD-10-CM | POA: Diagnosis not present

## 2024-02-10 DIAGNOSIS — Z7985 Long-term (current) use of injectable non-insulin antidiabetic drugs: Secondary | ICD-10-CM | POA: Diagnosis not present

## 2024-02-10 MED ORDER — BLOOD GLUCOSE MONITORING SUPPL DEVI: 1.0000 | 0 refills | Status: AC

## 2024-02-10 MED ORDER — POLYETHYLENE GLYCOL 3350 17 GM/SCOOP PO POWD: 17.0000 g | Freq: Two times a day (BID) | ORAL | 1 refills | Status: AC | PRN

## 2024-02-10 MED ORDER — OZEMPIC (0.25 OR 0.5 MG/DOSE) 2 MG/3ML ~~LOC~~ SOPN: 0.5000 mg | PEN_INJECTOR | SUBCUTANEOUS | 4 refills | Status: DC

## 2024-02-10 MED ORDER — BLOOD GLUCOSE TEST VI STRP: 1.0000 | ORAL_STRIP | 0 refills | Status: AC

## 2024-02-10 MED ORDER — LANCETS MISC: 1.0000 | 0 refills | Status: AC

## 2024-02-10 MED ORDER — LANCET DEVICE MISC: 1.0000 | 0 refills | Status: AC

## 2024-02-10 NOTE — Progress Notes (Signed)
 "           Progress Note  Physician: Biance Moncrief A Bain Whichard, MD   Virtual Visit via Video Note  I connected with Dana Murillo on 02/10/2024 at  3:20 PM EST by a video enabled telemedicine application and verified that I am speaking with the correct person  HPI: Dana Murillo is a 64 y.o. female presenting on 02/10/2024 for No chief complaint on file. .  Discussed the use of AI scribe software for clinical note transcription with the patient, who gave verbal consent to proceed.  History of Present Illness   Dana Murillo is a 64 year old female with type 2 diabetes who presents for lab review and follow-up on Ozempic  treatment.  Glycemic control and diabetes management - Type 2 diabetes managed with Ozempic , started approximately six weeks ago - Hemoglobin A1c improved from 6.9% (September) to 6.7% - No home blood glucose monitoring  - Feeling tired in the afternoons - Jardiance discontinued approximately six weeks ago when Ozempic  was initiated; impact on glycemic control and renal function under evaluation  Gastrointestinal symptoms - Constipation since starting Ozempic , consistent with slowed gut motility - Decreased appetite since starting Ozempic  - Weight loss by one pound only in the last week  Fatigue - Increased fatigue, particularly in the afternoons and evenings, since starting Ozempic   Renal function and hypertension - Microalbumin creatinine ratio increased from 62 (when losartan  was started in 2024) to 146.  Increase predictably since discontinuing jardiance - Currently taking losartan  for hypertension.  BP at home not known - Blood pressure last checked at a medical facility  Physical activity and respiratory status - No issues with breathing - Limited engagement in physical activity         Medical history:  Relevant past medical, surgical, family and social history reviewed and updated as indicated. Interim medical history since our last visit  reviewed.  Allergies and medications reviewed and updated.   ROS: Negative unless specifically indicated above in HPI.   Current Medications[1]       Objective:     There were no vitals taken for this visit.  Wt Readings from Last 3 Encounters:  12/14/23 255 lb (115.7 kg)  12/14/23 255 lb 9.6 oz (115.9 kg)  11/16/23 255 lb 3.2 oz (115.8 kg)         Assessment & Plan:   Encounter Diagnoses  Name Primary?   Type 2 diabetes mellitus with other specified complication, without long-term current use of insulin (HCC) Yes   Diabetic nephropathy associated with type 2 diabetes mellitus (HCC)    Morbid obesity (HCC)     No orders of the defined types were placed in this encounter.    Assessment and Plan    Type 2 diabetes mellitus with diabetic nephropathy Recent improvement in hemoglobin A1c to 6.7%. Increased microalbumin creatinine ratio indicates proteinuria. Monitoring for hypoglycemia due to metformin  and Ozempic . Potential need to restart Jardiance if proteinuria persists.   - Continue Ozempic  0.5 mg for four weeks, monitor weight loss and appetite. - Monitor blood pressure at home, target <130/80 mmHg. - Recheck hemoglobin A1c and microalbumin creatinine ratio in May.   - Consider restarting Jardiance if proteinuria persists.  Given her A1C we may cause her to be too low with both medications and metformin .  - Provided glucometer for home blood sugar monitoring.  Obesity - Continue at 0.5 mg ozempic .  Will evaluate for titration in several weeks.   Hypertension Managed  with losartan . Blood pressure control essential to prevent nephropathy progression. Current readings satisfactory, home monitoring advised. - Continue losartan  50 mg, carvedilol  25 mg BID and amloidpine 5 mg - Monitor blood pressure at home regularly.  Constipation Likely side effect of Ozempic  due to slowed gastrointestinal motility. Using stool softener. - Recommend Miralax  daily or every other  day.  Fatigue Noted in afternoon and mid-evening, possibly related to Ozempic . No hypoglycemia reported. Further evaluation needed if symptoms persist. - Monitor blood sugar during fatigue to rule out hypoglycemia. - Encourage regular physical activity and hydration. - Follow-up in 2-3 weeks weeks to reassess fatigue and medication efficacy.                 [1]  Current Outpatient Medications:    polyethylene glycol powder (GLYCOLAX /MIRALAX ) 17 GM/SCOOP powder, Take 17 g by mouth 2 (two) times daily as needed. Dissolve 1 capful (17g) in 4-8 ounces of liquid and take by mouth daily., Disp: 3350 g, Rfl: 1   Semaglutide ,0.25 or 0.5MG /DOS, (OZEMPIC , 0.25 OR 0.5 MG/DOSE,) 2 MG/3ML SOPN, Inject 0.5 mg into the skin once a week., Disp: 3 mL, Rfl: 4   acetaminophen  (TYLENOL  8 HOUR) 650 MG CR tablet, , Disp: , Rfl:    albuterol (VENTOLIN HFA) 108 (90 Base) MCG/ACT inhaler, Inhale into the lungs every 6 (six) hours as needed for wheezing or shortness of breath., Disp: , Rfl:    amLODipine  (NORVASC ) 5 MG tablet, Take 1 tablet (5 mg total) by mouth daily., Disp: 90 tablet, Rfl: 1   budesonide -formoterol  (SYMBICORT ) 160-4.5 MCG/ACT inhaler, Inhale 2 puffs into the lungs 2 (two) times daily. Dispense brand name only., Disp: 10.2 g, Rfl: 3   Calcium Carbonate-Vit D-Min (CALCIUM 1200 PO), Take 1 tablet by mouth daily. , Disp: , Rfl:    carvedilol  (COREG ) 25 MG tablet, Take 1 tablet (25 mg total) by mouth 2 (two) times daily with a meal., Disp: 90 tablet, Rfl: 0   divalproex  (DEPAKOTE ) 250 MG DR tablet, Take 250-500 mg by mouth 3 (three) times daily. Take 250 at breakfast , 500 at lunch, and 500 in the evening, Disp: , Rfl:    fish oil-omega-3 fatty acids 1000 MG capsule, Take 4 g by mouth daily. , Disp: , Rfl:    fluticasone  (FLONASE ) 50 MCG/ACT nasal spray, Place 2 sprays into both nostrils daily., Disp: 16 g, Rfl: 6   glucosamine-chondroitin 500-400 MG tablet, Take 1 tablet by mouth daily. , Disp: ,  Rfl:    losartan  (COZAAR ) 50 MG tablet, Take 1 tablet (50 mg total) by mouth daily., Disp: 90 tablet, Rfl: 0   magnesium gluconate (MAGONATE) 500 (27 Mg) MG TABS tablet, Take 480 mg by mouth daily., Disp: , Rfl:    metFORMIN  (GLUCOPHAGE ) 500 MG tablet, Take 1 tablet (500 mg total) by mouth daily with breakfast., Disp: 90 tablet, Rfl: 0   Multiple Vitamins-Minerals (MULTIVITAMIN WITH MINERALS) tablet, Take 1 tablet by mouth daily., Disp: , Rfl:    rosuvastatin (CRESTOR) 20 MG tablet, Take 20 mg by mouth daily., Disp: , Rfl:   "

## 2024-02-24 ENCOUNTER — Telehealth

## 2024-02-24 DIAGNOSIS — G40909 Epilepsy, unspecified, not intractable, without status epilepticus: Secondary | ICD-10-CM | POA: Diagnosis not present

## 2024-02-24 DIAGNOSIS — Z7985 Long-term (current) use of injectable non-insulin antidiabetic drugs: Secondary | ICD-10-CM | POA: Diagnosis not present

## 2024-02-24 DIAGNOSIS — E1169 Type 2 diabetes mellitus with other specified complication: Secondary | ICD-10-CM

## 2024-02-24 MED ORDER — EMPAGLIFLOZIN 25 MG PO TABS: 25.0000 mg | ORAL_TABLET | Freq: Every day | ORAL | 3 refills | Status: AC

## 2024-02-24 NOTE — Progress Notes (Signed)
 "           Progress Note  Physician: Jenny Omdahl A Macon Lesesne, MD   I connected with  Dana Murillo [place] on 02/24/24 by a video enabled telemedicine application and verified that I am speaking with the correct person   HPI: Dana Murillo is a 64 y.o. female presenting on 02/24/2024 for No chief complaint on file. .  Discussed the use of AI scribe software for clinical note transcription with the patient, who gave verbal consent to proceed.  History of Present Illness   Dana Murillo is a 64 year old female with type 2 diabetes who presents with concerns about her current medication, Ozempic , and its lack of efficacy.  Hyperglycemia - Blood glucose increased to 133 mg/dL today, previously 885 mg/dL on February 13, 2024 - No improvement in glycemic control despite ongoing Ozempic  therapy.  - Last Ozempic  injection administered on Monday  Weight gain - Weight increased from 252 lbs to 255 lbs over the past two to four weeks - No weight loss observed while on Ozempic , contrary to expected benefit - Dietary monitoring and increased physical activity (walking) without other identifiable cause for weight gain  Gastrointestinal side effects - Constipation present as a side effect of Ozempic      Medical history:  Relevant past medical, surgical, family and social history reviewed and updated as indicated. Interim medical history since our last visit reviewed.  Allergies and medications reviewed and updated.   ROS: Negative unless specifically indicated above in HPI.   Current Medications[1]       Objective:     There were no vitals taken for this visit.  Wt Readings from Last 3 Encounters:  12/14/23 255 lb (115.7 kg)  12/14/23 255 lb 9.6 oz (115.9 kg)  11/16/23 255 lb 3.2 oz (115.8 kg)   Assessment & Plan:   Encounter Diagnoses  Name Primary?   Type 2 diabetes mellitus with other specified complication, without long-term current use of insulin (HCC) Yes   Seizure  disorder (HCC)    Morbid obesity (HCC)     No orders of the defined types were placed in this encounter.    Assessment and Plan     #1 type 2 diabetes.  We have reached suboptimal dosing range for Ozempic .  I am fairly confident if we increase dosage we may see some weight decline however patient is having side effects and mild elevation of blood sugar.  She would like to resume start Jardiance and so I will resend this to pharmacy she is to discontinue Ozempic  plan for us  to check labs again in mid April and repeat to check on microalbumin creatinine  Follow-up with other concerns  2.  Obesity.  Problematic has this is driving hypertension dyspnea and other comorbidities.  She reports a healthy diet and is physically active.  Make discuss other options at follow-up visit.                  [1]  Current Outpatient Medications:    empagliflozin (JARDIANCE) 25 MG TABS tablet, Take 1 tablet (25 mg total) by mouth daily., Disp: 90 tablet, Rfl: 3   acetaminophen  (TYLENOL  8 HOUR) 650 MG CR tablet, , Disp: , Rfl:    albuterol (VENTOLIN HFA) 108 (90 Base) MCG/ACT inhaler, Inhale into the lungs every 6 (six) hours as needed for wheezing or shortness of breath., Disp: , Rfl:    amLODipine  (NORVASC ) 5 MG tablet, Take 1 tablet (5 mg total)  by mouth daily., Disp: 90 tablet, Rfl: 1   Blood Glucose Monitoring Suppl DEVI, 1 each by Does not apply route as directed. Dispense based on patient and insurance preference. Use up to four times daily as directed. (FOR ICD-10 E10.9, E11.9)., Disp: 1 each, Rfl: 0   budesonide -formoterol  (SYMBICORT ) 160-4.5 MCG/ACT inhaler, Inhale 2 puffs into the lungs 2 (two) times daily. Dispense brand name only., Disp: 10.2 g, Rfl: 3   Calcium Carbonate-Vit D-Min (CALCIUM 1200 PO), Take 1 tablet by mouth daily. , Disp: , Rfl:    carvedilol  (COREG ) 25 MG tablet, Take 1 tablet (25 mg total) by mouth 2 (two) times daily with a meal., Disp: 90 tablet, Rfl: 0   divalproex   (DEPAKOTE ) 250 MG DR tablet, Take 250-500 mg by mouth 3 (three) times daily. Take 250 at breakfast , 500 at lunch, and 500 in the evening, Disp: , Rfl:    fish oil-omega-3 fatty acids 1000 MG capsule, Take 4 g by mouth daily. , Disp: , Rfl:    glucosamine-chondroitin 500-400 MG tablet, Take 1 tablet by mouth daily. , Disp: , Rfl:    Glucose Blood (BLOOD GLUCOSE TEST STRIPS) STRP, 1 each by Does not apply route as directed. Dispense based on patient and insurance preference. Use up to four times daily as directed. (FOR ICD-10 E10.9, E11.9)., Disp: 100 strip, Rfl: 0   Lancet Device MISC, 1 each by Does not apply route as directed. Dispense based on patient and insurance preference. Use up to four times daily as directed. (FOR ICD-10 E10.9, E11.9)., Disp: 1 each, Rfl: 0   Lancets MISC, 1 each by Does not apply route as directed. Dispense based on patient and insurance preference. Use up to four times daily as directed. (FOR ICD-10 E10.9, E11.9)., Disp: 100 each, Rfl: 0   losartan  (COZAAR ) 50 MG tablet, Take 1 tablet (50 mg total) by mouth daily., Disp: 90 tablet, Rfl: 0   magnesium gluconate (MAGONATE) 500 (27 Mg) MG TABS tablet, Take 480 mg by mouth daily., Disp: , Rfl:    metFORMIN  (GLUCOPHAGE ) 500 MG tablet, Take 1 tablet (500 mg total) by mouth daily with breakfast., Disp: 90 tablet, Rfl: 0   Multiple Vitamins-Minerals (MULTIVITAMIN WITH MINERALS) tablet, Take 1 tablet by mouth daily., Disp: , Rfl:    polyethylene glycol powder (GLYCOLAX /MIRALAX ) 17 GM/SCOOP powder, Take 17 g by mouth 2 (two) times daily as needed. Dissolve 1 capful (17g) in 4-8 ounces of liquid and take by mouth daily., Disp: 3350 g, Rfl: 1   rosuvastatin (CRESTOR) 20 MG tablet, Take 20 mg by mouth daily., Disp: , Rfl:   "

## 2024-02-28 ENCOUNTER — Other Ambulatory Visit: Payer: Self-pay

## 2024-02-29 ENCOUNTER — Other Ambulatory Visit: Payer: Self-pay

## 2024-02-29 MED ORDER — LOSARTAN POTASSIUM 50 MG PO TABS: 50.0000 mg | ORAL_TABLET | Freq: Every day | ORAL | 0 refills | Status: AC

## 2024-03-02 ENCOUNTER — Ambulatory Visit

## 2024-06-18 ENCOUNTER — Other Ambulatory Visit

## 2024-06-27 ENCOUNTER — Ambulatory Visit
